# Patient Record
Sex: Female | Born: 1995 | Race: Black or African American | Hispanic: No | Marital: Single | Smoking: Never smoker
Health system: Southern US, Community
[De-identification: ages and names within clinical notes are randomized; demographics above are authoritative.]

## PROBLEM LIST (undated history)

## (undated) DIAGNOSIS — D219 Benign neoplasm of connective and other soft tissue, unspecified: Secondary | ICD-10-CM

## (undated) DIAGNOSIS — E162 Hypoglycemia, unspecified: Secondary | ICD-10-CM

## (undated) DIAGNOSIS — L309 Dermatitis, unspecified: Secondary | ICD-10-CM

## (undated) HISTORY — DX: Hypoglycemia, unspecified: E16.2

## (undated) HISTORY — DX: Dermatitis, unspecified: L30.9

## (undated) HISTORY — PX: OTHER SURGICAL HISTORY: SHX169

## (undated) HISTORY — PX: ROOT CANAL: SHX2363

---
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
## 2015-01-04 ENCOUNTER — Ambulatory Visit: Payer: Medicaid Other

## 2015-01-07 ENCOUNTER — Encounter: Payer: Self-pay | Admitting: *Deleted

## 2015-01-07 ENCOUNTER — Ambulatory Visit: Payer: Medicaid Other

## 2015-01-07 ENCOUNTER — Ambulatory Visit (INDEPENDENT_AMBULATORY_CARE_PROVIDER_SITE_OTHER): Payer: Medicaid Other | Admitting: *Deleted

## 2015-01-07 DIAGNOSIS — Z3042 Encounter for surveillance of injectable contraceptive: Secondary | ICD-10-CM | POA: Diagnosis not present

## 2015-01-07 DIAGNOSIS — Z3202 Encounter for pregnancy test, result negative: Secondary | ICD-10-CM

## 2015-01-07 LAB — POCT URINE PREGNANCY: Preg Test, Ur: NEGATIVE

## 2015-01-07 MED ORDER — MEDROXYPROGESTERONE ACETATE 150 MG/ML IM SUSP
150.0000 mg | Freq: Once | INTRAMUSCULAR | Status: AC
  Administered 2015-01-07: 150 mg via INTRAMUSCULAR

## 2015-01-07 NOTE — Progress Notes (Signed)
Pt here for Depo. Reports no problems at this time. Return in 12 weeks for next shot. [name] 

## 2015-03-07 ENCOUNTER — Encounter: Payer: Self-pay | Admitting: Orthopedic Surgery

## 2015-03-07 ENCOUNTER — Ambulatory Visit (INDEPENDENT_AMBULATORY_CARE_PROVIDER_SITE_OTHER): Payer: Medicaid Other

## 2015-03-07 ENCOUNTER — Ambulatory Visit (INDEPENDENT_AMBULATORY_CARE_PROVIDER_SITE_OTHER): Payer: Medicaid Other | Admitting: Orthopedic Surgery

## 2015-03-07 VITALS — BP 108/70 | Ht 63.0 in | Wt 110.0 lb

## 2015-03-07 DIAGNOSIS — M222X2 Patellofemoral disorders, left knee: Secondary | ICD-10-CM

## 2015-03-07 DIAGNOSIS — M25562 Pain in left knee: Secondary | ICD-10-CM | POA: Diagnosis not present

## 2015-03-07 MED ORDER — DICLOFENAC SODIUM 75 MG PO TBEC: 75.0000 mg | DELAYED_RELEASE_TABLET | Freq: Two times a day (BID) | ORAL | Status: DC

## 2015-03-07 NOTE — Progress Notes (Signed)
Patient ID: Heidi Foley, female   DOB: 09/15/1995, 19 y.o.   MRN: [id number]  Chief Complaint  Patient presents with  . Knee Pain    left knee pain, referred by B Mann    HPI Heidi Foley is a 19 y.o. female.   The patient presents for evaluation of ongoing left knee pain for 1 year. She denies any trauma. 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 She complains of pain locking stiffness giving way anterior aspect left knee associated with squatting kneeling bending climbing and sitting prolonged periods of time    She was treated with naproxen but she only took in a week because it wasn't working she also took some ibuprofen prior to  Review of systems constipation seasonal food allergies rashes vision problems  Review of Systems Review of Systems  Past Medical History  Diagnosis Date  . Fibroids   . Asthma     Past Surgical History  Procedure Laterality Date  . Nailbed surgery      nailbed reconstruction left ring finger  . Root canal      Family History  Problem Relation Age of Onset  . Hypertension Mother   . Heart disease Other   . Cancer Other   . Diabetes Other   . Stroke Other     Social History Social History  Substance Use Topics  . Smoking status: Never Smoker   . Smokeless tobacco: Never Used  . Alcohol Use: No    Allergies  Allergen Reactions  . Bee Venom Anaphylaxis  . Citrus Swelling    Current Outpatient Prescriptions  Medication Sig Dispense Refill  . medroxyPROGESTERone (DEPO-PROVERA) 150 MG/ML injection INJECT INTRAMUSCULARLY IN THE OFFICE EVERY 3 MONTHS 1 mL 3  . diclofenac (VOLTAREN) 75 MG EC tablet Take 1 tablet (75 mg total) by mouth 2 (two) times daily with a meal. 60 tablet 2   No current facility-administered medications for this visit.       Physical Exam Physical Exam Blood pressure 108/70, height 5\' 3"  (1.6 m), weight 110 lb (49.896 kg).  Her appearance is normal for height and weight appropriate. She is oriented 3  mood and affect are normal. She has normal gait pattern and stance normal except for hyperextension of both knees.  Right knee exam was normal. Patellofemoral joint was nontender. Apprehension was negative. Ligaments were stable hip range of motion was normal muscle tone and strength are normal skin intact sensation normal pulses good. Flexible pes planus  Left knee very tender patellofemoral joint medial and lateral facet no apprehension for range of motion. Strength normal stability tests normal neurovascular exam intact skin normal    Data Reviewed  I ordered and reviewed an x-ray which was normal  Assessment     patellofemoral pain syndrome     Plan     physical therapy 6 weeks Meds ordered this encounter  Medications  . diclofenac (VOLTAREN) 75 MG EC tablet    Sig: Take 1 tablet (75 mg total) by mouth 2 (two) times daily with a meal.    Dispense:  60 tablet    Refill:  2     Recheck 2 months       Arther Abbott 03/07/2015, 11:44 AM

## 2015-03-07 NOTE — Patient Instructions (Signed)
Call aph therapy dept to schedule therapy visits  Orthotics  New med, diclofenac

## 2015-03-15 ENCOUNTER — Ambulatory Visit (HOSPITAL_COMMUNITY): Payer: Medicaid Other | Attending: Orthopedic Surgery | Admitting: Physical Therapy

## 2015-03-15 DIAGNOSIS — R29898 Other symptoms and signs involving the musculoskeletal system: Secondary | ICD-10-CM

## 2015-03-15 DIAGNOSIS — M25562 Pain in left knee: Secondary | ICD-10-CM | POA: Diagnosis present

## 2015-03-15 DIAGNOSIS — R262 Difficulty in walking, not elsewhere classified: Secondary | ICD-10-CM | POA: Diagnosis present

## 2015-03-15 NOTE — Therapy (Signed)
Heidi Foley, [place], [address] Phone: [phone]   Fax:  [phone]  Physical Therapy Evaluation  Patient Details  Name: Heidi Foley MRN: [id number] Date of Birth: [birth year] Referring Provider:  Carole Civil, MD  Encounter Date: 03/15/2015      PT End of Session - 03/15/15 1611    Visit Number 1   Number of Visits 18   Date for PT Re-Evaluation 04/14/15   Authorization Type medicaid   PT Start Time 1540   PT Stop Time 1610   PT Time Calculation (min) 30 min   Activity Tolerance Patient tolerated treatment well   Behavior During Therapy [facility] for tasks assessed/performed      Past Medical History  Diagnosis Date  . Fibroids   . Asthma     Past Surgical History  Procedure Laterality Date  . Nailbed surgery      nailbed reconstruction left ring finger  . Root canal      There were no vitals filed for this visit.  Visit Diagnosis:  Knee pain, acute, left  Left leg weakness  Difficulty walking      Subjective Assessment - 03/15/15 1540    Subjective Heidi Foley states that she has been dancing for several years and has had progressive knee pain for about the past year.  She has been referreed to PT to attempt to decrease her pain. She states that if she bends her knee too long or is walking too long she will have increased pain.  She states that she was at work when her knee almost gave out.     How long can you sit comfortably? 30 minutes    How long can you stand comfortably? ten minutes    How long can you walk comfortably? 5-10 minutes.    Patient Stated Goals less pain    Currently in Pain? No/denies  highest is a 8/10    Pain Location Knee   Pain Orientation Left;Anterior;Medial   Pain Descriptors / Indicators Burning;Sharp   Pain Type Chronic pain  progressinve             [facility] PT Assessment - 03/15/15 0001    Assessment   Medical Diagnosis Lt anterior knee pain   Onset Date/Surgical Date 03/14/14   Next MD Visit October    Prior Therapy none   Precautions   Precautions None   Restrictions   Weight Bearing Restrictions No   Balance Screen   Has the patient fallen in the past 6 months No   Has the patient had a decrease in activity level because of a fear of falling?  No   Is the patient reluctant to leave their home because of a fear of falling?  No   Prior Function   Level of Independence Independent   Vocation Part time employment;Student   Vocation Requirements standing   Cognition   Overall Cognitive Status Within Functional Limits for tasks assessed   ROM / Strength   AROM / PROM / Strength AROM;Strength   AROM   AROM Assessment Site Knee   Strength   Strength Assessment Site Hip;Knee;Ankle   Right/Left Hip Left   Left Hip Flexion 3/5   Left Hip ABduction 4/5   Right/Left Knee Left   Left Knee Flexion 3+/5   Left Knee Extension 3+/5   Right/Left Ankle Left   Left Ankle Dorsiflexion 4+/5   Flexibility   Soft Tissue Assessment /Muscle Length yes  Hamstrings Rt 155;Lt 140   Quadriceps Heel to buttock B              PT Education - 03/15/15 1610    Education provided Yes   Education Details Strengthening for hip and knee mm   Person(s) Educated Patient   Methods Explanation;Verbal cues;Handout   Comprehension Verbalized understanding;Returned demonstration          PT Short Term Goals - 03/15/15 1618    PT SHORT TERM GOAL #1   Title I HEP   Time 1   Period Days   PT SHORT TERM GOAL #2   Title Pt to state her pain goes no higher than a 4/10 with normal activity    Time 4   Period Weeks   PT SHORT TERM GOAL #3   Title Pt mm strength to have increased 1/2 grade to allow pt to walk for 20 minutes without pain   Time 4   Period Weeks   PT SHORT TERM GOAL #4   Title Pt to be able to sit for an hour to concenrate on lectures without increased knee paiin   Time 4   Period Weeks           PT Long Term Goals  - 03/15/15 1621    PT LONG TERM GOAL #1   Title I in advance HEP   Time 7   Period Weeks   PT LONG TERM GOAL #2   Title Pt pain to be no greater than a 2/10 with activity   Time 7   Period Weeks   PT LONG TERM GOAL #3   Title Pt strength to by 5/5 to allow the above to occur   Time 7   Period Weeks   PT LONG TERM GOAL #4   Title Hamstring length to be to 155 to decrease stress on knee joint    Time 7   Period Weeks   PT LONG TERM GOAL #5   Title Pt to be able to complete workday without increased pain   Time 7   Period Weeks               Plan - 03/15/15 1612    Clinical Impression Statement Heidi Foley is an 19 yo female who has been having progressive Lt knee pain for the past year.  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  Examination demonstrates decreased hamstring length and decreased hip and knee strength of her Lt LE resulting in increased pain.  Heidi Foley will benefit from skilled PT to address the above issues and improve her quality of life.l    Pt will benefit from skilled therapeutic intervention in order to improve on the following deficits Decreased activity tolerance;Pain;Difficulty walking;Decreased strength   Rehab Potential Good   PT Frequency 3x / week   PT Duration 6 weeks   PT Treatment/Interventions ADLs/Self Care Home Management;Stair training;Therapeutic activities;Therapeutic exercise;Balance training;Neuromuscular re-education;Patient/family education   PT Next Visit Plan Pt to begin rockerboard; Terminal extension with LE ER in both standing and supine; SLS progressing to vector stances, functional squats progressing to Lt squat only, heel raises , and lunging    PT Home Exercise Plan given   Consulted and Agree with Plan of Care Patient         Problem List There are no active problems to display for this patient.  Heidi Foley, PT  CLT [phone] 03/15/2015, 4:24 PM  [place] [address] [place], [place], [address] Phone: [phone]   Fax:  [phone]

## 2015-03-15 NOTE — Patient Instructions (Addendum)
Knee Extension (Sitting)   Place __3__ pound weight on left ankle and straighten knee fully, lower slowly. Repeat ___10-15_ times per set. Do ___1_ sets per session. Do ___2_ sessions per day.  http://orth.exer.us/732   Copyright  VHI. All rights reserved.  Straight Leg Raise: With External Leg Rotation   Lie on back with right leg straight, opposite leg bent. Rotate straight leg out and lift _20___ inches. Repeat __10-115__ times per set. Do __1__ sets per session. Do _2___ sessions per day.  http://orth.exer.us/728   Copyright  VHI. All rights reserved.  Strengthening: Quadriceps Set   Tighten muscles on top of thighs by pushing knees down into surface. Hold _3___ seconds. Repeat _10___ times per set. Do ___1_ sets per session. Do __2__ sessions per day.  http://orth.exer.us/602   Copyright  VHI. All rights reserved.  Strengthening: Hip Abduction (Side-Lying)  With 3# weight  Tighten muscles on front of left thigh, then lift leg _20___ inches from surface, keeping knee locked.  Repeat ___10_ times per set. Do _1___ sets per session. Do _2___ sessions per day.  http://orth.exer.us/622   Copyright  VHI. All rights reserved.  Strengthening: Hip Extension (Prone)  With 3# weight on your foot  Tighten muscles on front of left thigh, then lift leg __2__ inches from surface, keeping knee locked. Repeat _10-15___ times per set. Do __1__ sets per session. Do _2___ sessions per day.  http://orth.exer.us/620   Copyright  VHI. All rights reserved.  Self-Mobilization: Knee Flexion (Prone)  With a 3# weight on your ankle Bring left heel toward buttocks as close as possible. Hold ___2_ seconds. Relax. Repeat __10__ times per set. Do _1___ sets per session. Do __2__ sessions per day.  http://orth.exer.us/596   Copyright  VHI. All rights reserved.  Stretching: Hamstring (Supine)   Supporting right thigh behind knee, slowly straighten knee until stretch is felt in back  of thigh. Hold __30__ seconds. Repeat ___3_ times per set. Do _1___ sets per session. Do ___2_ sessions per day.  http://orth.exer.us/656   Copyright  VHI. All rights reserved.  Heel Raise: Bilateral (Standing)   Rise on balls of feet. Repeat __10__ times per set. Do _1___ sets per session. Do __2__ sessions per day.  http://orth.exer.us/38   Copyright  VHI. All rights reserved.  Functional Quadriceps: Chair Squat   Keeping feet flat on floor, shoulder width apart, squat as low as is comfortable. Use support as necessary. Repeat _10___ times per set. Do __1__ sets per session. Do _2___ sessions per day.  http://orth.exer.us/736   Copyright  VHI. All rights reserved.

## 2015-03-29 ENCOUNTER — Ambulatory Visit (HOSPITAL_COMMUNITY): Payer: Medicaid Other | Attending: Orthopedic Surgery

## 2015-03-29 ENCOUNTER — Encounter (HOSPITAL_COMMUNITY): Payer: Self-pay

## 2015-03-29 DIAGNOSIS — R262 Difficulty in walking, not elsewhere classified: Secondary | ICD-10-CM | POA: Diagnosis present

## 2015-03-29 DIAGNOSIS — R29898 Other symptoms and signs involving the musculoskeletal system: Secondary | ICD-10-CM | POA: Insufficient documentation

## 2015-03-29 DIAGNOSIS — M25562 Pain in left knee: Secondary | ICD-10-CM

## 2015-03-29 NOTE — Therapy (Addendum)
[place], [place], [address] Phone: [phone]   Fax:  [phone]  Physical Therapy Treatment  Patient Details  Name: KIMYETTA FLOTT MRN: [id number] Date of Birth: 1996-01-29 Referring Provider:  Carole Civil, MD  Encounter Date: 03/29/2015      PT End of Session - 03/29/15 1638    Visit Number 2   Number of Visits 18   Date for PT Re-Evaluation 04/14/15   Authorization Type medicaid   Authorization - Visit Number 2   PT Start Time 2023   PT Stop Time 3435   PT Time Calculation (min) 44 min   Activity Tolerance Patient tolerated treatment well;No increased pain   Behavior During Therapy [facility] for tasks assessed/performed      Past Medical History  Diagnosis Date  . Fibroids   . Asthma     Past Surgical History  Procedure Laterality Date  . Nailbed surgery      nailbed reconstruction left ring finger  . Root canal      There were no vitals filed for this visit.  Visit Diagnosis:  Knee pain, acute, left  Left leg weakness  Difficulty walking      Subjective Assessment - 03/29/15 1631    Subjective Knee pain remains about the same, but pt now reports new calf pain which she also relates to current issue. Pt is unclear about HEP, but is able to demonstrate some of it when cued.                          [place] Adult PT Treatment/Exercise - 03/29/15 0001    Lumbar Exercises: Prone   Other Prone Lumbar Exercises Plank on knees, 10x3s  cues given for neutral pelvis, avoiding lordosis   Knee/Hip Exercises: Stretches   Gastroc Stretch Both;3 reps;30 seconds  heels hanging off 8" step   Knee/Hip Exercises: Standing   Functional Squat 2 sets;Limitations  2x12, using 14" box for butt taps.    Knee/Hip Exercises: Supine   Bridges with Clamshell 2 sets;10 reps;Strengthening;Both  GreenTB + Transverse Abd cues   Other Supine Knee/Hip Exercises Clam Shells  2x10 Bilat GreenTB     Knee/Hip Exercises: Sidelying   Hip ABduction 2 sets;15 reps;Both  heavy cues given for joint alignment.    Manual Therapy   Myofascial Release 5 minutes  medial L soleus release                  PT Short Term Goals - 03/15/15 1618    PT SHORT TERM GOAL #1   Title I HEP   Time 1   Period Days   PT SHORT TERM GOAL #2   Title Pt to state her pain goes no higher than a 4/10 with normal activity    Time 4   Period Weeks   PT SHORT TERM GOAL #3   Title Pt mm strength to have increased 1/2 grade to allow pt to walk for 20 minutes without pain   Time 4   Period Weeks   PT SHORT TERM GOAL #4   Title Pt to be able to sit for an hour to concenrate on lectures without increased knee paiin   Time 4   Period Weeks           PT Long Term Goals - 03/15/15 1621    PT LONG TERM GOAL #1   Title I in advance HEP  Time 7   Period Weeks   PT LONG TERM GOAL #2   Title Pt pain to be no greater than a 2/10 with activity   Time 7   Period Weeks   PT LONG TERM GOAL #3   Title Pt strength to by 5/5 to allow the above to occur   Time 7   Period Weeks   PT LONG TERM GOAL #4   Title Hamstring length to be to 155 to decrease stress on knee joint    Time 7   Period Weeks   PT LONG TERM GOAL #5   Title Pt to be able to complete workday without increased pain   Time 7   Period Weeks               Plan - 03/29/15 1633    Clinical Impression Statement Pt is making progress, responding well to postural education and performance of thereapeutic exercises with appropriate posture adn mototr control. Pt contniues to demonstrate the greatest limtations in core strength, and the ability to maintain neutral posture with daily acitvities and mobility. Pt will continue to benefit from core strengthening, postural education, and  pain management techniques in the LLE.    Pt will benefit from skilled therapeutic intervention in order to improve on the following deficits Decreased  activity tolerance;Pain;Difficulty walking;Decreased strength   Rehab Potential Good   PT Frequency 3x / week   PT Duration 6 weeks   PT Treatment/Interventions ADLs/Self Care Home Management;Stair training;Therapeutic activities;Therapeutic exercise;Balance training;Neuromuscular re-education;Patient/family education   PT Next Visit Plan Continue to progress pt core strength, finding neutral pelvic position with all activity, avoiding excessing lumbar lordosis, and avoiding genu recurevatum bilat.    PT Home Exercise Plan updated and reviewed; patient asked to modify calf raises to 4x10 to decrease soleus aggravation, and added calf stretching. Pt was quite TFL domiant with sidelying abduction and educated on maintaining a neutral pelvic posture with this activity.    Consulted and Agree with Plan of Care Patient        Problem List There are no active problems to display for this patient.   Janeliz Prestwood C 03/29/2015, 4:43 PM  4:43 PM  Etta Grandchild, PT, DPT [place] License # [id number]        [facility] [address] [place], [place], [address] Phone: [phone]   Fax:  [phone]  PHYSICAL THERAPY DISCHARGE SUMMARY  Visits from Start of Care: 2  Current functional level related to goals / functional outcomes: No change   Remaining deficits: same   Education / Equipment: HE{  Plan: Patient agrees to discharge.  Patient goals were not met. Patient is being discharged due to the patient's request.  ?????       PT is in college and school schedule does not allow her to come to appointments.  Rayetta Humphrey, [place] CLT [phone]

## 2015-04-02 ENCOUNTER — Ambulatory Visit: Payer: Medicaid Other

## 2015-04-02 ENCOUNTER — Encounter: Payer: Self-pay | Admitting: Obstetrics & Gynecology

## 2015-04-03 ENCOUNTER — Telehealth (HOSPITAL_COMMUNITY): Payer: Self-pay | Admitting: Physical Therapy

## 2015-04-03 ENCOUNTER — Ambulatory Visit (HOSPITAL_COMMUNITY): Payer: Medicaid Other | Admitting: Physical Therapy

## 2015-04-04 NOTE — Telephone Encounter (Signed)
Pt called re: missed appointments.  Pt mother stated pt was at school.  Left message to please have pt call the clinic.  Rayetta Humphrey, [place] CLT [phone]

## 2015-04-05 ENCOUNTER — Encounter: Payer: Self-pay | Admitting: *Deleted

## 2015-04-05 ENCOUNTER — Telehealth (HOSPITAL_COMMUNITY): Payer: Self-pay

## 2015-04-05 ENCOUNTER — Ambulatory Visit (HOSPITAL_COMMUNITY): Payer: Medicaid Other

## 2015-04-05 ENCOUNTER — Ambulatory Visit (INDEPENDENT_AMBULATORY_CARE_PROVIDER_SITE_OTHER): Payer: Medicaid Other | Admitting: *Deleted

## 2015-04-05 DIAGNOSIS — Z3042 Encounter for surveillance of injectable contraceptive: Secondary | ICD-10-CM

## 2015-04-05 DIAGNOSIS — Z3202 Encounter for pregnancy test, result negative: Secondary | ICD-10-CM

## 2015-04-05 LAB — POCT URINE PREGNANCY: Preg Test, Ur: NEGATIVE

## 2015-04-05 MED ORDER — MEDROXYPROGESTERONE ACETATE 150 MG/ML IM SUSP
150.0000 mg | Freq: Once | INTRAMUSCULAR | Status: AC
  Administered 2015-04-05: 150 mg via INTRAMUSCULAR

## 2015-04-05 NOTE — Progress Notes (Signed)
Pt here for Depo. Reports no problems at this time. Return in 12 weeks for next shot. [name] 

## 2015-04-05 NOTE — Telephone Encounter (Signed)
No show for apt today, called and left message indicating missed apt., next apt date and time, informed policy for 3 no shows, contact information given.  [address], [place]; [facility] [phone]

## 2015-04-08 ENCOUNTER — Ambulatory Visit (HOSPITAL_COMMUNITY): Payer: Medicaid Other

## 2015-04-10 ENCOUNTER — Encounter (HOSPITAL_COMMUNITY): Payer: Medicaid Other | Admitting: Physical Therapy

## 2015-04-12 ENCOUNTER — Encounter (HOSPITAL_COMMUNITY): Payer: Medicaid Other | Admitting: Physical Therapy

## 2015-04-15 ENCOUNTER — Encounter (HOSPITAL_COMMUNITY): Payer: Medicaid Other | Admitting: Physical Therapy

## 2015-04-17 ENCOUNTER — Encounter (HOSPITAL_COMMUNITY): Payer: Medicaid Other | Admitting: Physical Therapy

## 2015-04-19 ENCOUNTER — Encounter (HOSPITAL_COMMUNITY): Payer: Medicaid Other | Admitting: Physical Therapy

## 2015-04-22 ENCOUNTER — Encounter (HOSPITAL_COMMUNITY): Payer: Medicaid Other | Admitting: Physical Therapy

## 2015-04-24 ENCOUNTER — Encounter (HOSPITAL_COMMUNITY): Payer: Medicaid Other

## 2015-04-26 ENCOUNTER — Encounter (HOSPITAL_COMMUNITY): Payer: Medicaid Other | Admitting: Physical Therapy

## 2015-04-29 ENCOUNTER — Encounter (HOSPITAL_COMMUNITY): Payer: Medicaid Other | Admitting: Physical Therapy

## 2015-05-01 ENCOUNTER — Encounter (HOSPITAL_COMMUNITY): Payer: Medicaid Other | Admitting: Physical Therapy

## 2015-05-03 ENCOUNTER — Encounter (HOSPITAL_COMMUNITY): Payer: Medicaid Other

## 2015-05-06 ENCOUNTER — Encounter (HOSPITAL_COMMUNITY): Payer: Medicaid Other | Admitting: Physical Therapy

## 2015-05-08 ENCOUNTER — Encounter (HOSPITAL_COMMUNITY): Payer: Medicaid Other | Admitting: Physical Therapy

## 2015-05-10 ENCOUNTER — Encounter (HOSPITAL_COMMUNITY): Payer: Medicaid Other

## 2015-05-13 ENCOUNTER — Encounter: Payer: Self-pay | Admitting: Orthopedic Surgery

## 2015-05-13 ENCOUNTER — Ambulatory Visit: Payer: Medicaid Other | Admitting: Orthopedic Surgery

## 2015-05-31 ENCOUNTER — Other Ambulatory Visit (HOSPITAL_COMMUNITY): Payer: Self-pay | Admitting: Family Medicine

## 2015-05-31 ENCOUNTER — Ambulatory Visit (HOSPITAL_COMMUNITY)
Admission: RE | Admit: 2015-05-31 | Discharge: 2015-05-31 | Disposition: A | Discharge status: Home / Self Care | Payer: Medicaid Other | Source: Ambulatory Visit | Attending: Family Medicine | Admitting: Family Medicine

## 2015-05-31 DIAGNOSIS — R519 Headache, unspecified: Secondary | ICD-10-CM

## 2015-05-31 DIAGNOSIS — R51 Headache: Principal | ICD-10-CM

## 2015-06-28 ENCOUNTER — Ambulatory Visit: Payer: Medicaid Other

## 2015-07-01 ENCOUNTER — Ambulatory Visit (INDEPENDENT_AMBULATORY_CARE_PROVIDER_SITE_OTHER): Payer: Medicaid Other | Admitting: *Deleted

## 2015-07-01 ENCOUNTER — Encounter: Payer: Self-pay | Admitting: *Deleted

## 2015-07-01 DIAGNOSIS — Z3202 Encounter for pregnancy test, result negative: Secondary | ICD-10-CM

## 2015-07-01 DIAGNOSIS — Z3042 Encounter for surveillance of injectable contraceptive: Secondary | ICD-10-CM

## 2015-07-01 LAB — POCT URINE PREGNANCY: Preg Test, Ur: NEGATIVE

## 2015-07-01 MED ORDER — MEDROXYPROGESTERONE ACETATE 150 MG/ML IM SUSP
150.0000 mg | Freq: Once | INTRAMUSCULAR | Status: AC
  Administered 2015-07-01: 150 mg via INTRAMUSCULAR

## 2015-07-01 NOTE — Progress Notes (Signed)
Pt here for Depo. Reports no problems at this time. Return in 12 weeks for next shot. [name] 

## 2015-09-16 ENCOUNTER — Ambulatory Visit (INDEPENDENT_AMBULATORY_CARE_PROVIDER_SITE_OTHER): Payer: BC Managed Care – PPO

## 2015-09-16 ENCOUNTER — Ambulatory Visit (INDEPENDENT_AMBULATORY_CARE_PROVIDER_SITE_OTHER): Payer: BC Managed Care – PPO | Admitting: Orthopedic Surgery

## 2015-09-16 ENCOUNTER — Encounter: Payer: Self-pay | Admitting: Orthopedic Surgery

## 2015-09-16 VITALS — BP 104/66 | Ht 63.0 in | Wt 115.0 lb

## 2015-09-16 DIAGNOSIS — M5410 Radiculopathy, site unspecified: Secondary | ICD-10-CM

## 2015-09-16 MED ORDER — METHOCARBAMOL 500 MG PO TABS: 500.0000 mg | ORAL_TABLET | Freq: Every day | ORAL | Status: DC

## 2015-09-16 MED ORDER — IBUPROFEN 800 MG PO TABS: 800.0000 mg | ORAL_TABLET | Freq: Three times a day (TID) | ORAL | Status: DC

## 2015-09-16 MED ORDER — PREDNISONE 5 MG (48) PO TBPK: ORAL_TABLET | ORAL | Status: DC

## 2015-09-16 NOTE — Patient Instructions (Addendum)
  Recommend 5 mg dose pack x 12 days  Robaxin 500 mg daily at bedtime Ibuprofen 800 3 times a day 4 weeks f/u   Radicular Pain Radicular pain in either the arm or leg is usually from a bulging or herniated disk in the spine. A piece of the herniated disk may press against the nerves as the nerves exit the spine. This causes pain which is felt at the tips of the nerves down the arm or leg. Other causes of radicular pain may include:  Fractures.  Heart disease.  Cancer.  An abnormal and usually degenerative state of the nervous system or nerves (neuropathy). Diagnosis may require CT or MRI scanning to determine the primary cause.  Nerves that start at the neck (nerve roots) may cause radicular pain in the outer shoulder and arm. It can spread down to the thumb and fingers. The symptoms vary depending on which nerve root has been affected. In most cases radicular pain improves with conservative treatment. Neck problems may require physical therapy, a neck collar, or cervical traction. Treatment may take many weeks, and surgery may be considered if the symptoms do not improve.  Conservative treatment is also recommended for sciatica. Sciatica causes pain to radiate from the lower back or buttock area down the leg into the foot. Often there is a history of back problems. Most patients with sciatica are better after 2 to 4 weeks of rest and other supportive care. Short term bed rest can reduce the disk pressure considerably. Sitting, however, is not a good position since this increases the pressure on the disk. You should avoid bending, lifting, and all other activities which make the problem worse. Traction can be used in severe cases. Surgery is usually reserved for patients who do not improve within the first months of treatment. Only take over-the-counter or prescription medicines for pain, discomfort, or fever as directed by your caregiver. Narcotics and muscle relaxants may help by relieving more  severe pain and spasm and by providing mild sedation. Cold or massage can give significant relief. Spinal manipulation is not recommended. It can increase the degree of disc protrusion. Epidural steroid injections are often effective treatment for radicular pain. These injections deliver medicine to the spinal nerve in the space between the protective covering of the spinal cord and back bones (vertebrae). Your caregiver can give you more information about steroid injections. These injections are most effective when given within two weeks of the onset of pain.  You should see your caregiver for follow up care as recommended. A program for neck and back injury rehabilitation with stretching and strengthening exercises is an important part of management.  SEEK IMMEDIATE MEDICAL CARE IF:  You develop increased pain, weakness, or numbness in your arm or leg.  You develop difficulty with bladder or bowel control.  You develop abdominal pain.   This information is not intended to replace advice given to you by your health care provider. Make sure you discuss any questions you have with your health care provider.   Document Released: 08/20/2004 Document Revised: 08/03/2014 Document Reviewed: 02/06/2015 Elsevier Interactive Patient Education [employer].

## 2015-09-16 NOTE — Progress Notes (Signed)
Patient ID: Heidi Foley, female   DOB: [birth year], 20 y.o.   MRN: [id number]  Chief Complaint  Patient presents with  . Follow-up    RE EVAL LEFT KNEE PAIN AND WEAKNESS    HPI Heidi Foley is a 20 y.o. female.  Comes back in after treatment for anterior knee pain complaining of left knee pain and weakness. Although she denies back pain she says her left hip does hurt and places her hand on her lower back. She has radicular pain numbness and tingling in the left leg and giving way symptoms and this is been present for several weeks now. She was advised by primary care to stop dancing  The pain in her knee is referred from the back.  No fever or chills no weight loss. The numbness tingling as noted. Some intermittent gait abnormalities. No tremor no seizure no anxiety or depression. Her bowel and bladder function remains intact  Review of Systems Review of Systems history of present illness    Past Medical History  Diagnosis Date  . Fibroids   . Asthma     Past Surgical History  Procedure Laterality Date  . Nailbed surgery      nailbed reconstruction left ring finger  . Root canal      Family History  Problem Relation Age of Onset  . Hypertension Mother   . Heart disease Other   . Cancer Other   . Diabetes Other   . Stroke Other     Social History Social History  Substance Use Topics  . Smoking status: Never Smoker   . Smokeless tobacco: Never Used  . Alcohol Use: No    Allergies  Allergen Reactions  . Bee Venom Anaphylaxis  . Citrus Swelling    Current Outpatient Prescriptions  Medication Sig Dispense Refill  . medroxyPROGESTERone (DEPO-PROVERA) 150 MG/ML injection INJECT INTRAMUSCULARLY IN THE OFFICE EVERY 3 MONTHS 1 mL 3   No current facility-administered medications for this visit.       Physical Exam Physical Exam Blood pressure 104/66, height 5\' 3"  (1.6 m), weight 115 lb (52.164 kg). Appearance, there are no abnormalities in terms of  appearance the patient was well-developed and well-nourished. The grooming and hygiene were normal.  Mental status orientation, there was normal alertness and orientation Mood pleasant Ambulatory status normal with no assistive devices  Examination of the lumbar spine reveal tenderness at the L4-5 disc space and also on the  right gluteal area  and lower back.  Decreased spinal flexion extension and rotation were noted.  The lower extremities exhibited normal dorsiflexion plantar flexion extension flexion of the knee as well as hip flexion. No atrophy was noted. The skin overlying the lumbar thoracic and cervical regions was warm dry and intact without laceration. The lower extremities also had normal skin.  Neurologic examination  Reflexes were 2+ and equal  Sensation wasabnormal with decreased sensation in L4 and L5 sensory dermatomes on the left compared to the right   Babinski's tests were down going  Straight leg raise testing wasabnormal with positive findings on the left   The vascular examination revealed normal dorsalis pedis pulses in both feet and both feet were warm with good capillary refill   Data Reviewed Have ordered a plain film of the lumbar spine and the x-ray was taken in the office and I read it as follows  Mild scoliosis Assessment  Sciatic nerve pain  Plan  Recommend 5 mg dose pack x 12 days  Robaxin  500 mg daily at bedtime Ibuprofen 803 times a day 4 weeks f/u

## 2015-10-14 ENCOUNTER — Ambulatory Visit (INDEPENDENT_AMBULATORY_CARE_PROVIDER_SITE_OTHER): Payer: BC Managed Care – PPO | Admitting: Orthopedic Surgery

## 2015-10-14 ENCOUNTER — Encounter: Payer: Self-pay | Admitting: Orthopedic Surgery

## 2015-10-14 VITALS — Ht 63.0 in | Wt 115.0 lb

## 2015-10-14 DIAGNOSIS — M5442 Lumbago with sciatica, left side: Secondary | ICD-10-CM | POA: Diagnosis not present

## 2015-10-14 DIAGNOSIS — M5116 Intervertebral disc disorders with radiculopathy, lumbar region: Secondary | ICD-10-CM | POA: Diagnosis not present

## 2015-10-14 MED ORDER — GABAPENTIN 100 MG PO CAPS: 100.0000 mg | ORAL_CAPSULE | Freq: Three times a day (TID) | ORAL | Status: DC

## 2015-10-14 MED ORDER — NAPROXEN 500 MG PO TABS: 500.0000 mg | ORAL_TABLET | Freq: Two times a day (BID) | ORAL | Status: DC

## 2015-10-14 NOTE — Patient Instructions (Signed)
TWO MEDICATIONS SENT TO PHARMACY  WE WILL SCHEDULE MRI FOR YOU AND CALL YOU WITH APPT

## 2015-10-14 NOTE — Progress Notes (Signed)
Chief Complaint  Patient presents with  . Follow-up    FOLLOW UP BACK    Follow-up visit 20 year old female left leg pain and weakness. I treated her with a Dosepak Robaxin and ibuprofen for 4 weeks her pain is worse  At this point it is prudent to get an MRI to evaluate disc for possible compression Review of Systems  Constitutional: Negative for fever, chills, weight loss and malaise/fatigue.  Musculoskeletal: Positive for back pain. Negative for falls.  Neurological: Positive for tingling and sensory change.    MRI will be ordered  New medications  Meds ordered this encounter  Medications  . gabapentin (NEURONTIN) 100 MG capsule    Sig: Take 1 capsule (100 mg total) by mouth 3 (three) times daily.    Dispense:  30 capsule    Refill:  0  . naproxen (NAPROSYN) 500 MG tablet    Sig: Take 1 tablet (500 mg total) by mouth 2 (two) times daily with a meal.    Dispense:  60 tablet    Refill:  0

## 2015-10-20 ENCOUNTER — Other Ambulatory Visit: Payer: Self-pay | Admitting: Adult Health

## 2015-10-21 ENCOUNTER — Ambulatory Visit (INDEPENDENT_AMBULATORY_CARE_PROVIDER_SITE_OTHER): Payer: BC Managed Care – PPO | Admitting: *Deleted

## 2015-10-21 ENCOUNTER — Other Ambulatory Visit: Payer: Self-pay | Admitting: *Deleted

## 2015-10-21 ENCOUNTER — Encounter: Payer: Self-pay | Admitting: *Deleted

## 2015-10-21 DIAGNOSIS — Z3042 Encounter for surveillance of injectable contraceptive: Secondary | ICD-10-CM | POA: Diagnosis not present

## 2015-10-21 LAB — POCT URINE PREGNANCY: Preg Test, Ur: NEGATIVE

## 2015-10-21 MED ORDER — MEDROXYPROGESTERONE ACETATE 150 MG/ML IM SUSP: INTRAMUSCULAR | Status: DC

## 2015-10-21 MED ORDER — MEDROXYPROGESTERONE ACETATE 150 MG/ML IM SUSP
150.0000 mg | Freq: Once | INTRAMUSCULAR | Status: AC
  Administered 2015-10-21: 150 mg via INTRAMUSCULAR

## 2015-10-21 NOTE — Progress Notes (Signed)
Patient ID: Heidi Foley, female   DOB: 04-03-1996, 20 y.o.   MRN: [id number] Depo Provera 150 mg IM left deltoid given with no complications, negative pregnancy test. Pt late for depo injection x 2 weeks, discussed with Dr. Elonda Husky. Pt states last sex months ago, ok to give. Pt to return in 12 weeks for next injection.

## 2015-10-24 ENCOUNTER — Ambulatory Visit (HOSPITAL_COMMUNITY)
Admission: RE | Admit: 2015-10-24 | Discharge: 2015-10-24 | Disposition: A | Discharge status: Home / Self Care | Payer: BC Managed Care – PPO | Source: Ambulatory Visit | Attending: Orthopedic Surgery | Admitting: Orthopedic Surgery

## 2015-10-24 DIAGNOSIS — M419 Scoliosis, unspecified: Secondary | ICD-10-CM | POA: Diagnosis not present

## 2015-10-24 DIAGNOSIS — M5442 Lumbago with sciatica, left side: Secondary | ICD-10-CM | POA: Insufficient documentation

## 2015-10-28 ENCOUNTER — Ambulatory Visit (INDEPENDENT_AMBULATORY_CARE_PROVIDER_SITE_OTHER): Payer: BC Managed Care – PPO | Admitting: Orthopedic Surgery

## 2015-10-28 ENCOUNTER — Encounter: Payer: Self-pay | Admitting: Orthopedic Surgery

## 2015-10-28 VITALS — BP 106/52 | HR 102 | Ht 63.0 in | Wt 125.0 lb

## 2015-10-28 DIAGNOSIS — M5410 Radiculopathy, site unspecified: Secondary | ICD-10-CM

## 2015-10-28 MED ORDER — NABUMETONE 500 MG PO TABS: 500.0000 mg | ORAL_TABLET | Freq: Every day | ORAL | Status: DC

## 2015-10-28 NOTE — Progress Notes (Signed)
Patient ID: Heidi Foley, female   DOB: 07-29-1995, 20 y.o.   MRN: [id number]  Chief Complaint  Patient presents with  . Results    MRI RESULTS    HPI follow-up visit after MRI and this 20 year old female complains of radicular pain in her left leg her MRI was normal except for scoliosis. She's been on naproxen prednisone ibuprofen Robaxin and gabapentin with no relief  ROS she denies any bowel or bladder dysfunction  BP 106/52 mmHg  Pulse 102  Ht 5\' 3"  (1.6 m)  Wt 125 lb (56.7 kg)  BMI 22.15 kg/m2  Physical Exam  Ortho Exam   MRI was reviewed with the report and agree there is no disc herniation or nerve compression after my independent review ASSESSMENT AND PLAN   Radicular leg pain  Stop gabapentin start Relafen 500 twice a day and start physical therapy one visit for back exercises (patient cannot go except on Fridays and doesn't have time in her schedule because she is in college.)  I will see her in 6 weeks

## 2015-10-28 NOTE — Addendum Note (Signed)
Addended by: Moreen Fowler R on: 10/28/2015 12:09 PM   Modules accepted: Orders

## 2015-10-28 NOTE — Patient Instructions (Addendum)
PT 1 visit for back exercises   Stop taking neurontin  Start relafen 500 twice daily   Radicular Pain Radicular pain in either the arm or leg is usually from a bulging or herniated disk in the spine. A piece of the herniated disk may press against the nerves as the nerves exit the spine. This causes pain which is felt at the tips of the nerves down the arm or leg. Other causes of radicular pain may include:  Fractures.  Heart disease.  Cancer.  An abnormal and usually degenerative state of the nervous system or nerves (neuropathy). Diagnosis may require CT or MRI scanning to determine the primary cause.  Nerves that start at the neck (nerve roots) may cause radicular pain in the outer shoulder and arm. It can spread down to the thumb and fingers. The symptoms vary depending on which nerve root has been affected. In most cases radicular pain improves with conservative treatment. Neck problems may require physical therapy, a neck collar, or cervical traction. Treatment may take many weeks, and surgery may be considered if the symptoms do not improve.  Conservative treatment is also recommended for sciatica. Sciatica causes pain to radiate from the lower back or buttock area down the leg into the foot. Often there is a history of back problems. Most patients with sciatica are better after 2 to 4 weeks of rest and other supportive care. Short term bed rest can reduce the disk pressure considerably. Sitting, however, is not a good position since this increases the pressure on the disk. You should avoid bending, lifting, and all other activities which make the problem worse. Traction can be used in severe cases. Surgery is usually reserved for patients who do not improve within the first months of treatment. Only take over-the-counter or prescription medicines for pain, discomfort, or fever as directed by your caregiver. Narcotics and muscle relaxants may help by relieving more severe pain and spasm and  by providing mild sedation. Cold or massage can give significant relief. Spinal manipulation is not recommended. It can increase the degree of disc protrusion. Epidural steroid injections are often effective treatment for radicular pain. These injections deliver medicine to the spinal nerve in the space between the protective covering of the spinal cord and back bones (vertebrae). Your caregiver can give you more information about steroid injections. These injections are most effective when given within two weeks of the onset of pain.  You should see your caregiver for follow up care as recommended. A program for neck and back injury rehabilitation with stretching and strengthening exercises is an important part of management.  SEEK IMMEDIATE MEDICAL CARE IF:  You develop increased pain, weakness, or numbness in your arm or leg.  You develop difficulty with bladder or bowel control.  You develop abdominal pain.   This information is not intended to replace advice given to you by your health care provider. Make sure you discuss any questions you have with your health care provider.   Document Released: 08/20/2004 Document Revised: 08/03/2014 Document Reviewed: 02/06/2015 Elsevier Interactive Patient Education [employer].

## 2015-11-22 ENCOUNTER — Ambulatory Visit (HOSPITAL_COMMUNITY): Payer: BC Managed Care – PPO | Admitting: Physical Therapy

## 2015-12-03 ENCOUNTER — Ambulatory Visit (HOSPITAL_COMMUNITY): Payer: BC Managed Care – PPO | Attending: Orthopedic Surgery

## 2015-12-09 ENCOUNTER — Ambulatory Visit: Payer: BC Managed Care – PPO | Admitting: Orthopedic Surgery

## 2016-01-13 ENCOUNTER — Encounter: Payer: Self-pay | Admitting: *Deleted

## 2016-01-13 ENCOUNTER — Ambulatory Visit (INDEPENDENT_AMBULATORY_CARE_PROVIDER_SITE_OTHER): Payer: BC Managed Care – PPO | Admitting: *Deleted

## 2016-01-13 DIAGNOSIS — Z3042 Encounter for surveillance of injectable contraceptive: Secondary | ICD-10-CM | POA: Diagnosis not present

## 2016-01-13 DIAGNOSIS — Z3202 Encounter for pregnancy test, result negative: Secondary | ICD-10-CM

## 2016-01-13 LAB — POCT URINE PREGNANCY: Preg Test, Ur: NEGATIVE

## 2016-01-13 MED ORDER — MEDROXYPROGESTERONE ACETATE 150 MG/ML IM SUSP
150.0000 mg | Freq: Once | INTRAMUSCULAR | Status: AC
  Administered 2016-01-13: 150 mg via INTRAMUSCULAR

## 2016-01-13 NOTE — Progress Notes (Signed)
Pt here for Depo. Pt tolerated shot well. Return in 12 weeks for next shot. [name] 

## 2016-01-16 ENCOUNTER — Emergency Department (HOSPITAL_COMMUNITY)
Admission: EM | Admit: 2016-01-16 | Discharge: 2016-01-17 | Disposition: A | Discharge status: Home / Self Care | Payer: BC Managed Care – PPO | Attending: Emergency Medicine | Admitting: Emergency Medicine

## 2016-01-16 ENCOUNTER — Other Ambulatory Visit: Payer: Self-pay

## 2016-01-16 ENCOUNTER — Encounter (HOSPITAL_COMMUNITY): Payer: Self-pay | Admitting: *Deleted

## 2016-01-16 DIAGNOSIS — J029 Acute pharyngitis, unspecified: Secondary | ICD-10-CM

## 2016-01-16 DIAGNOSIS — J4520 Mild intermittent asthma, uncomplicated: Secondary | ICD-10-CM

## 2016-01-16 DIAGNOSIS — J45909 Unspecified asthma, uncomplicated: Secondary | ICD-10-CM | POA: Insufficient documentation

## 2016-01-16 DIAGNOSIS — J069 Acute upper respiratory infection, unspecified: Secondary | ICD-10-CM | POA: Insufficient documentation

## 2016-01-16 MED ORDER — IBUPROFEN 800 MG PO TABS
800.0000 mg | ORAL_TABLET | Freq: Once | ORAL | Status: AC
  Administered 2016-01-16: 800 mg via ORAL
  Filled 2016-01-16: qty 1

## 2016-01-16 NOTE — ED Notes (Signed)
Pt c/o sore throat, headache, chest tightness, chills that started tonight before getting in shower,

## 2016-01-16 NOTE — ED Notes (Signed)
Pt c/o headache, sore throat, chest tightness

## 2016-01-17 LAB — RAPID STREP SCREEN (MED CTR MEBANE ONLY): Streptococcus, Group A Screen (Direct): NEGATIVE

## 2016-01-17 MED ORDER — PREDNISONE 20 MG PO TABS: 40.0000 mg | ORAL_TABLET | Freq: Every day | ORAL | Status: DC

## 2016-01-17 MED ORDER — ALBUTEROL SULFATE HFA 108 (90 BASE) MCG/ACT IN AERS
2.0000 | INHALATION_SPRAY | Freq: Once | RESPIRATORY_TRACT | Status: AC
  Administered 2016-01-17: 2 via RESPIRATORY_TRACT
  Filled 2016-01-17: qty 6.7

## 2016-01-17 NOTE — Discharge Instructions (Signed)
Dayquil, nyquil, zicam  You appear to have an upper respiratory infection (URI). An upper respiratory tract infection, or cold, is a viral infection of the air passages leading to the lungs. It is contagious and can be spread to others, especially during the first 3 or 4 days. It cannot be cured by antibiotics or other medicines. RETURN IMMEDIATELY IF you develop shortness of breath, confusion or altered mental status, a new rash, become dizzy, faint, or poorly responsive, or are unable to be cared for at home.   Cough, Adult Coughing is a reflex that clears your throat and your airways. Coughing helps to heal and protect your lungs. It is normal to cough occasionally, but a cough that happens with other symptoms or lasts a long time may be a sign of a condition that needs treatment. A cough may last only 2-3 weeks (acute), or it may last longer than 8 weeks (chronic). CAUSES Coughing is commonly caused by:  Breathing in substances that irritate your lungs.  A viral or bacterial respiratory infection.  Allergies.  Asthma.  Postnasal drip.  Smoking.  Acid backing up from the stomach into the esophagus (gastroesophageal reflux).  Certain medicines.  Chronic lung problems, including COPD (or rarely, lung cancer).  Other medical conditions such as heart failure. HOME CARE INSTRUCTIONS  Pay attention to any changes in your symptoms. Take these actions to help with your discomfort:  Take medicines only as told by your health care provider.  If you were prescribed an antibiotic medicine, take it as told by your health care provider. Do not stop taking the antibiotic even if you start to feel better.  Talk with your health care provider before you take a cough suppressant medicine.  Drink enough fluid to keep your urine clear or pale yellow.  If the air is dry, use a cold steam vaporizer or humidifier in your bedroom or your home to help loosen secretions.  Avoid anything that  causes you to cough at work or at home.  If your cough is worse at night, try sleeping in a semi-upright position.  Avoid cigarette smoke. If you smoke, quit smoking. If you need help quitting, ask your health care provider.  Avoid caffeine.  Avoid alcohol.  Rest as needed. SEEK MEDICAL CARE IF:   You have new symptoms.  You cough up pus.  Your cough does not get better after 2-3 weeks, or your cough gets worse.  You cannot control your cough with suppressant medicines and you are losing sleep.  You develop pain that is getting worse or pain that is not controlled with pain medicines.  You have a fever.  You have unexplained weight loss.  You have night sweats. SEEK IMMEDIATE MEDICAL CARE IF:  You cough up blood.  You have difficulty breathing.  Your heartbeat is very fast.   This information is not intended to replace advice given to you by your health care provider. Make sure you discuss any questions you have with your health care provider.   Document Released: 01/09/2011 Document Revised: 04/03/2015 Document Reviewed: 09/19/2014 Elsevier Interactive Patient Education 2016 Elsevier Inc.  Viral Infections A viral infection can be caused by different types of viruses.Most viral infections are not serious and resolve on their own. However, some infections may cause severe symptoms and may lead to further complications. SYMPTOMS Viruses can frequently cause:  Minor sore throat.  Aches and pains.  Headaches.  Runny nose.  Different types of rashes.  Watery eyes.  Tiredness.  Cough.  Loss of appetite.  Gastrointestinal infections, resulting in nausea, vomiting, and diarrhea. These symptoms do not respond to antibiotics because the infection is not caused by bacteria. However, you might catch a bacterial infection following the viral infection. This is sometimes called a "superinfection." Symptoms of such a bacterial infection may include:  Worsening  sore throat with pus and difficulty swallowing.  Swollen neck glands.  Chills and a high or persistent fever.  Severe headache.  Tenderness over the sinuses.  Persistent overall ill feeling (malaise), muscle aches, and tiredness (fatigue).  Persistent cough.  Yellow, green, or brown mucus production with coughing. HOME CARE INSTRUCTIONS   Only take over-the-counter or prescription medicines for pain, discomfort, diarrhea, or fever as directed by your caregiver.  Drink enough water and fluids to keep your urine clear or pale yellow. Sports drinks can provide valuable electrolytes, sugars, and hydration.  Get plenty of rest and maintain proper nutrition. Soups and broths with crackers or rice are fine. SEEK IMMEDIATE MEDICAL CARE IF:   You have severe headaches, shortness of breath, chest pain, neck pain, or an unusual rash.  You have uncontrolled vomiting, diarrhea, or you are unable to keep down fluids.  You or your child has an oral temperature above 102 F (38.9 C), not controlled by medicine.  Your baby is older than 3 months with a rectal temperature of 102 F (38.9 C) or higher.  Your baby is 72 months old or younger with a rectal temperature of 100.4 F (38 C) or higher. MAKE SURE YOU:   Understand these instructions.  Will watch your condition.  Will get help right away if you are not doing well or get worse.   This information is not intended to replace advice given to you by your health care provider. Make sure you discuss any questions you have with your health care provider.   Document Released: 04/22/2005 Document Revised: 10/05/2011 Document Reviewed: 12/19/2014 Elsevier Interactive Patient Education 2016 Elsevier Inc.  Sore Throat A sore throat is pain, burning, irritation, or scratchiness of the throat. There is often pain or tenderness when swallowing or talking. A sore throat may be accompanied by other symptoms, such as coughing, sneezing, fever,  and swollen neck glands. A sore throat is often the first sign of another sickness, such as a cold, flu, strep throat, or mononucleosis (commonly known as mono). Most sore throats go away without medical treatment. CAUSES  The most common causes of a sore throat include:  A viral infection, such as a cold, flu, or mono.  A bacterial infection, such as strep throat, tonsillitis, or whooping cough.  Seasonal allergies.  Dryness in the air.  Irritants, such as smoke or pollution.  Gastroesophageal reflux disease (GERD). HOME CARE INSTRUCTIONS   Only take over-the-counter medicines as directed by your caregiver.  Drink enough fluids to keep your urine clear or pale yellow.  Rest as needed.  Try using throat sprays, lozenges, or sucking on hard candy to ease any pain (if older than 4 years or as directed).  Sip warm liquids, such as broth, herbal tea, or warm water with honey to relieve pain temporarily. You may also eat or drink cold or frozen liquids such as frozen ice pops.  Gargle with salt water (mix 1 tsp salt with 8 oz of water).  Do not smoke and avoid secondhand smoke.  Put a cool-mist humidifier in your bedroom at night to moisten the air. You can also turn on a hot shower  and sit in the bathroom with the door closed for 5-10 minutes. SEEK IMMEDIATE MEDICAL CARE IF:  You have difficulty breathing.  You are unable to swallow fluids, soft foods, or your saliva.  You have increased swelling in the throat.  Your sore throat does not get better in 7 days.  You have nausea and vomiting.  You have a fever or persistent symptoms for more than 2-3 days.  You have a fever and your symptoms suddenly get worse. MAKE SURE YOU:   Understand these instructions.  Will watch your condition.  Will get help right away if you are not doing well or get worse.   This information is not intended to replace advice given to you by your health care provider. Make sure you discuss  any questions you have with your health care provider.   Document Released: 08/20/2004 Document Revised: 08/03/2014 Document Reviewed: 03/20/2012 Elsevier Interactive Patient Education 2016 Elsevier Inc. Bronchospasm, Adult A bronchospasm is a spasm or tightening of the airways going into the lungs. During a bronchospasm breathing becomes more difficult because the airways get smaller. When this happens there can be coughing, a whistling sound when breathing (wheezing), and difficulty breathing. Bronchospasm is often associated with asthma, but not all patients who experience a bronchospasm have asthma. CAUSES  A bronchospasm is caused by inflammation or irritation of the airways. The inflammation or irritation may be triggered by:   Allergies (such as to animals, pollen, food, or mold). Allergens that cause bronchospasm may cause wheezing immediately after exposure or many hours later.   Infection. Viral infections are believed to be the most common cause of bronchospasm.   Exercise.   Irritants (such as pollution, cigarette smoke, strong odors, aerosol sprays, and paint fumes).   Weather changes. Winds increase molds and pollens in the air. Rain refreshes the air by washing irritants out. Cold air may cause inflammation.   Stress and emotional upset.  SIGNS AND SYMPTOMS   Wheezing.   Excessive nighttime coughing.   Frequent or severe coughing with a simple cold.   Chest tightness.   Shortness of breath.  DIAGNOSIS  Bronchospasm is usually diagnosed through a history and physical exam. Tests, such as chest X-rays, are sometimes done to look for other conditions. TREATMENT   Inhaled medicines can be given to open up your airways and help you breathe. The medicines can be given using either an inhaler or a nebulizer machine.  Corticosteroid medicines may be given for severe bronchospasm, usually when it is associated with asthma. HOME CARE INSTRUCTIONS   Always have  a plan prepared for seeking medical care. Know when to call your health care provider and local emergency services (911 in the [place] Know where you can access local emergency care.  Only take medicines as directed by your health care provider.  If you were prescribed an inhaler or nebulizer machine, ask your health care provider to explain how to use it correctly. Always use a spacer with your inhaler if you were given one.  It is necessary to remain calm during an attack. Try to relax and breathe more slowly.  Control your home environment in the following ways:   Change your heating and air conditioning filter at least once a month.   Limit your use of fireplaces and wood stoves.  Do not smoke and do not allow smoking in your home.   Avoid exposure to perfumes and fragrances.   Get rid of pests (such as roaches and mice) and  their droppings.   Throw away plants if you see mold on them.   Keep your house clean and dust free.   Replace carpet with wood, tile, or vinyl flooring. Carpet can trap dander and dust.   Use allergy-proof pillows, mattress covers, and box spring covers.   Wash bed sheets and blankets every week in hot water and dry them in a dryer.   Use blankets that are made of polyester or cotton.   Wash hands frequently. SEEK MEDICAL CARE IF:   You have muscle aches.   You have chest pain.   The sputum changes from clear or white to yellow, green, gray, or bloody.   The sputum you cough up gets thicker.   There are problems that may be related to the medicine you are given, such as a rash, itching, swelling, or trouble breathing.  SEEK IMMEDIATE MEDICAL CARE IF:   You have worsening wheezing and coughing even after taking your prescribed medicines.   You have increased difficulty breathing.   You develop severe chest pain. MAKE SURE YOU:   Understand these instructions.  Will watch your condition.  Will get help right away if  you are not doing well or get worse.   This information is not intended to replace advice given to you by your health care provider. Make sure you discuss any questions you have with your health care provider.   Document Released: 07/16/2003 Document Revised: 08/03/2014 Document Reviewed: 01/02/2013 Elsevier Interactive Patient Education [employer].

## 2016-01-17 NOTE — ED Provider Notes (Signed)
CSN: [id number]     Arrival date & time 01/16/16  2321 History   First MD Initiated Contact with Patient 01/16/16 2337     Chief Complaint  Patient presents with  . Headache     (Consider location/radiation/quality/duration/timing/severity/associated sxs/prior Treatment) HPI 20 year old female brought in by her grandmother for chief complaint of viral illness symptoms. The patient was recently at the beach this past week with her brother and other family members. All of her family members came down with a sore throat, body aches, chills, cough. Patient states that her symptoms came on suddenly just before taking a shower this evening. She also has had associated cough and wheezing. He has a history of asthma. She is out of her current inhaler. The patient also has associated headache. She states that she sometimes gets migraine headaches and takes Motrin for her pain. She denies nausea, vomiting. She's never had strep throat previously. Past Medical History  Diagnosis Date  . Fibroids   . Asthma    Past Surgical History  Procedure Laterality Date  . Nailbed surgery      nailbed reconstruction left ring finger  . Root canal     Family History  Problem Relation Age of Onset  . Hypertension Mother   . Heart disease Other   . Cancer Other   . Diabetes Other   . Stroke Other    Social History  Substance Use Topics  . Smoking status: Never Smoker   . Smokeless tobacco: Never Used  . Alcohol Use: No   OB History    Gravida Para Term Preterm AB TAB SAB Ectopic Multiple Living   0 0 0 0 0 0 0 0 0 0      Review of Systems  Ten systems reviewed and are negative for acute change, except as noted in the HPI.    Allergies  Bee venom; Citrus; and Sulfa antibiotics  Home Medications   Prior to Admission medications   Medication Sig Start Date End Date Taking? Authorizing Provider  ibuprofen (ADVIL,MOTRIN) 800 MG tablet Take 1 tablet (800 mg total) by mouth 3 (three) times  daily. Patient taking differently: Take 800 mg by mouth as needed.  09/16/15   Carole Civil, MD  medroxyPROGESTERone (DEPO-PROVERA) 150 MG/ML injection INJECT INTRAMUSCULARLY IN THE OFFICE EVERY 3 MONTHS 10/21/15   Jonnie Kind, MD   BP 105/78 mmHg  Pulse 110  Temp(Src) 98.5 F (36.9 C) (Oral)  Resp 16  Ht 5\' 3"  (1.6 m)  Wt 55.792 kg  BMI 21.79 kg/m2  SpO2 100% Physical Exam Physical Exam  Nursing note and vitals reviewed. Constitutional: She is oriented to person, place, and time. She appears well-developed and well-nourished. No distress.  HENT:  Head: Normocephalic and atraumatic.  Eyes: Conjunctivae normal and EOM are normal. Pupils are equal, round, and reactive to light. No scleral icterus.  Neck: Normal range of motion.  Throat: mild erythema without exudates Cardiovascular: Normal rate, regular rhythm and normal heart sounds.  Exam reveals no gallop and no friction rub.   No murmur heard. Pulmonary/Chest: Effort normal and breath sounds normal. No respiratory distress.  Abdominal: Soft. Bowel sounds are normal. She exhibits no distension and no mass. There is no tenderness. There is no guarding.  Neurological: She is alert and oriented to person, place, and time.  Skin: Skin is warm and dry. She is not diaphoretic.    ED Course  Procedures (including critical care time) Labs Review Labs Reviewed  RAPID STREP SCREEN (  NOT AT [facility])    Imaging Review No results found. I have personally reviewed and evaluated these images and lab results as part of my medical decision-making.   EKG Interpretation   Date/Time:  Thursday January 16 2016 23:30:25 EDT Ventricular Rate:  104 PR Interval:    QRS Duration: 62 QT Interval:  324 QTC Calculation: 427 R Axis:   72 Text Interpretation:  Sinus tachycardia RSR' in V1 or V2, probably normal  variant Normal ECG Confirmed by POLLINA  MD, CHRISTOPHER [phone]) on  01/16/2016 11:39:25 PM      MDM   Final diagnoses:  URI  (upper respiratory infection)  Reactive airway disease, mild intermittent, uncomplicated  Sore throat    Negative strep.. Patients symptoms are consistent with URI, likely viral etiology. Discussed that antibiotics are not indicated for viral infections. Pt will be discharged with symptomatic treatment.  Verbalizes understanding and is agreeable with plan. Pt is hemodynamically stable & in NAD prior to dc.     Margarita Mail, PA-C 01/17/16 [place], MD 01/17/16 [phone]

## 2016-01-19 LAB — CULTURE, GROUP A STREP (THRC)

## 2016-04-09 ENCOUNTER — Ambulatory Visit (INDEPENDENT_AMBULATORY_CARE_PROVIDER_SITE_OTHER): Payer: BC Managed Care – PPO | Admitting: *Deleted

## 2016-04-09 ENCOUNTER — Encounter: Payer: Self-pay | Admitting: *Deleted

## 2016-04-09 DIAGNOSIS — Z3202 Encounter for pregnancy test, result negative: Secondary | ICD-10-CM

## 2016-04-09 DIAGNOSIS — Z3042 Encounter for surveillance of injectable contraceptive: Secondary | ICD-10-CM | POA: Diagnosis not present

## 2016-04-09 LAB — POCT URINE PREGNANCY: Preg Test, Ur: NEGATIVE

## 2016-04-09 MED ORDER — MEDROXYPROGESTERONE ACETATE 150 MG/ML IM SUSP
150.0000 mg | Freq: Once | INTRAMUSCULAR | Status: AC
Start: 2016-04-09 — End: 2016-04-09
  Administered 2016-04-09: 150 mg via INTRAMUSCULAR

## 2016-04-09 NOTE — Progress Notes (Signed)
Pt here for Depo. Pt tolerated shot well. Return in 12 weeks for next shot. [name] 

## 2016-05-04 ENCOUNTER — Ambulatory Visit (INDEPENDENT_AMBULATORY_CARE_PROVIDER_SITE_OTHER): Payer: BC Managed Care – PPO | Admitting: Adult Health

## 2016-05-04 ENCOUNTER — Encounter: Payer: Self-pay | Admitting: Adult Health

## 2016-05-04 VITALS — BP 88/70 | HR 72 | Ht 63.0 in | Wt 125.0 lb

## 2016-05-04 DIAGNOSIS — F3289 Other specified depressive episodes: Secondary | ICD-10-CM

## 2016-05-04 DIAGNOSIS — Z30011 Encounter for initial prescription of contraceptive pills: Secondary | ICD-10-CM

## 2016-05-04 DIAGNOSIS — Z3202 Encounter for pregnancy test, result negative: Secondary | ICD-10-CM | POA: Diagnosis not present

## 2016-05-04 LAB — POCT URINE PREGNANCY: Preg Test, Ur: NEGATIVE

## 2016-05-04 MED ORDER — NORETHIN-ETH ESTRAD-FE BIPHAS 1 MG-10 MCG / 10 MCG PO TABS: 1.0000 | ORAL_TABLET | Freq: Every day | ORAL | 4 refills | Status: DC

## 2016-05-04 NOTE — Progress Notes (Addendum)
Subjective:     Patient ID: Heidi Foley, female   DOB: 12/25/1995, 20 y.o.   MRN: [id number]  HPI Heidi Foley is a 20 year old black female in to change over from depo to to OCs,she says mom had brain tumor that was ? Associated with depo provera use, so she wants to stop it.   Review of Systems Patient denies any headaches, hearing loss, fatigue, blurred vision, shortness of breath, chest pain, abdominal pain, problems with bowel movements, urination, or intercourse. No joint pain or mood swings. Reviewed past medical,surgical, social and family history. Reviewed medications and allergies.     Objective:   Physical Exam BP (!) 88/70   Pulse 72   Ht 5\' 3"  (1.6 m)   Wt 125 lb (56.7 kg)   BMI 22.14 kg/m UPT negative,   Skin warm and dry. Lungs: clear to ausculation bilaterally. Cardiovascular: regular rate and rhythm. PHQ 9 score 8, she says she is depressed at times, but declines meds or counseling. Will D/C depo and start lo loestrin.  Assessment:     1. Encounter for initial prescription of contraceptive pills   2. Other depression   3. Pregnancy examination or test, negative result       Plan:     Rx lo loestrin disp 3 packs take 1 daily with 4 refills Use condoms Follow up in 3 months Pap at 21

## 2016-05-04 NOTE — Patient Instructions (Signed)
Start lo loestrin Sunday Use condoms  Follow up in 3 months  Pap at 21

## 2016-05-14 ENCOUNTER — Telehealth: Payer: Self-pay | Admitting: Adult Health

## 2016-05-14 NOTE — Telephone Encounter (Signed)
Pt came into the office today because she went to go take her birth control pill this morning and lost it. Pt came in to see what she can do. Spoke with Chrystal and was told to go ahead and take Friday's pill today. Pt might new an early refill of her birth control. Please contact pt

## 2016-05-14 NOTE — Telephone Encounter (Signed)
Spoke with pt letting her know to call when she gets to the end of current pack and maybe we will have a sample to give her. Pt just started her pack beginning of the week. Pt voiced understanding. [place]

## 2016-05-14 NOTE — Telephone Encounter (Signed)
Left message x 1. [name] 

## 2016-06-01 ENCOUNTER — Telehealth: Payer: Self-pay | Admitting: *Deleted

## 2016-06-01 NOTE — Telephone Encounter (Signed)
Spoke with pt letting her know we don't have any Lo Loestrin samples. Pt needs to start a new pack the end of this week. Advised to call back Wednesday or Friday to see if we have any samples and if not, pt can call pharmacy and get filled. Pt voiced understanding. [place]

## 2016-07-02 ENCOUNTER — Ambulatory Visit: Payer: BC Managed Care – PPO

## 2016-07-09 ENCOUNTER — Ambulatory Visit: Payer: BC Managed Care – PPO

## 2016-08-04 ENCOUNTER — Ambulatory Visit (INDEPENDENT_AMBULATORY_CARE_PROVIDER_SITE_OTHER): Payer: BC Managed Care – PPO | Admitting: Adult Health

## 2016-08-04 ENCOUNTER — Encounter: Payer: Self-pay | Admitting: Adult Health

## 2016-08-04 VITALS — BP 95/51 | HR 86 | Ht 64.0 in | Wt 124.0 lb

## 2016-08-04 DIAGNOSIS — F3289 Other specified depressive episodes: Secondary | ICD-10-CM | POA: Diagnosis not present

## 2016-08-04 DIAGNOSIS — Z3041 Encounter for surveillance of contraceptive pills: Secondary | ICD-10-CM

## 2016-08-04 NOTE — Progress Notes (Signed)
Subjective:     Patient ID: Genelle Bal, female   DOB: 08/21/1995, 21 y.o.   MRN: [id number]  HPI Marketta is a 21 year old black female back in follow up of starting OCs and is doing well,periods are good, but is having some increase in depression.  Review of Systems +depression  Periods good Reviewed past medical,surgical, social and family history. Reviewed medications and allergies.     Objective:   Physical Exam BP (!) 95/51 (BP Location: Left Arm, Patient Position: Sitting, Cuff Size: Normal)   Pulse 86   Ht 5\' 4"  (1.626 m)   Wt 124 lb (56.2 kg)   LMP 07/30/2016 (Approximate)   BMI 21.28 kg/m  Skin warm and dry. Lungs: clear to ausculation bilaterally. Cardiovascular: regular rate and rhythm.   PHQ 9 score 20, she is depressed at times and feels suicidal at times, but denies plan or that she would do it, she declines meds, but will go to counseling, will refer to [facility] and instructed if feelings change to go to ER. Face time 15 minutes with 50 % counseling.  Assessment:        1. Encounter for surveillance of contraceptive pills   2. Other depression    Plan:    Referred to [facility]  Continue lo loestrin Follow up 05/27/17 for pap and physical or sooner if needed

## 2017-04-07 ENCOUNTER — Telehealth: Payer: Self-pay | Admitting: *Deleted

## 2017-04-07 NOTE — Telephone Encounter (Signed)
Spoke with pt. Pt forgot to take birth control pill x 4 days. I spoke with JAG and advised pt to take 2 pills today and 2 pills tomorrow and to use back up until she starts her next period. Pt states she is bleeding now. I advised it's probably break through bleeding from missing pills and to use backup until next period. Pt wants to switch back to the shot. Pt was advised to schedule an appt. Pt voiced understanding. [place]

## 2017-04-08 ENCOUNTER — Encounter: Payer: Self-pay | Admitting: Adult Health

## 2017-04-08 ENCOUNTER — Ambulatory Visit (INDEPENDENT_AMBULATORY_CARE_PROVIDER_SITE_OTHER): Payer: BC Managed Care – PPO | Admitting: Adult Health

## 2017-04-08 VITALS — BP 110/68 | HR 79 | Ht 64.0 in | Wt 113.5 lb

## 2017-04-08 DIAGNOSIS — Z30013 Encounter for initial prescription of injectable contraceptive: Secondary | ICD-10-CM

## 2017-04-08 DIAGNOSIS — Z3202 Encounter for pregnancy test, result negative: Secondary | ICD-10-CM | POA: Diagnosis not present

## 2017-04-08 LAB — POCT URINE PREGNANCY: Preg Test, Ur: NEGATIVE

## 2017-04-08 MED ORDER — MEDROXYPROGESTERONE ACETATE 150 MG/ML IM SUSP: 150.0000 mg | INTRAMUSCULAR | 4 refills | Status: DC

## 2017-04-08 NOTE — Progress Notes (Signed)
Subjective:     Patient ID: Heidi Foley, female   DOB: 04/05/1996, 21 y.o.   MRN: [id number]  HPI Heidi Foley is a 21 year old black female in to get on depo, has been on OCs but forgets to take, at times.No sex in over a year.   Review of Systems Not depressed Has not had sex in over a year Forgets OCs  Reviewed past medical,surgical, social and family history. Reviewed medications and allergies.     Objective:   Physical Exam BP 110/68 (BP Location: Left Arm, Patient Position: Sitting, Cuff Size: Normal)   Pulse 79   Ht 5\' 4"  (1.626 m)   Wt 113 lb 8 oz (51.5 kg)   LMP 04/05/2017   BMI 19.48 kg/m UPT negative, Skin warm and dry. Lungs: clear to ausculation bilaterally. Cardiovascular: regular rate and rhythm.   PHQ 2 score 1.Declines STD testing.  Assessment:     1. Encounter for initial prescription of injectable contraceptive   2. Pregnancy examination or test, negative result       Plan:     Meds ordered this encounter  Medications  . medroxyPROGESTERone (DEPO-PROVERA) 150 MG/ML injection    Sig: Inject 1 mL (150 mg total) into the muscle every 3 (three) months.    Dispense:  1 mL    Refill:  4    Order Specific Question:   Supervising Provider    Answer:   Florian Buff [[id number]]  Return in 1 day for depo Pap and physical in November

## 2017-04-09 ENCOUNTER — Encounter: Payer: Self-pay | Admitting: *Deleted

## 2017-04-09 ENCOUNTER — Ambulatory Visit (INDEPENDENT_AMBULATORY_CARE_PROVIDER_SITE_OTHER): Payer: BC Managed Care – PPO | Admitting: *Deleted

## 2017-04-09 DIAGNOSIS — Z3202 Encounter for pregnancy test, result negative: Secondary | ICD-10-CM | POA: Diagnosis not present

## 2017-04-09 DIAGNOSIS — Z3042 Encounter for surveillance of injectable contraceptive: Secondary | ICD-10-CM

## 2017-04-09 DIAGNOSIS — Z308 Encounter for other contraceptive management: Secondary | ICD-10-CM

## 2017-04-09 LAB — POCT URINE PREGNANCY: Preg Test, Ur: NEGATIVE

## 2017-04-09 MED ORDER — MEDROXYPROGESTERONE ACETATE 150 MG/ML IM SUSP
150.0000 mg | Freq: Once | INTRAMUSCULAR | Status: AC
  Administered 2017-04-09: 150 mg via INTRAMUSCULAR

## 2017-04-09 NOTE — Progress Notes (Signed)
Pt here for Depo. Pt tolerated shot well. Return in 12 weeks for next shot. [name] 

## 2017-04-28 ENCOUNTER — Encounter: Payer: Self-pay | Admitting: *Deleted

## 2017-05-28 ENCOUNTER — Ambulatory Visit (INDEPENDENT_AMBULATORY_CARE_PROVIDER_SITE_OTHER): Payer: BC Managed Care – PPO | Admitting: Adult Health

## 2017-05-28 ENCOUNTER — Encounter: Payer: Self-pay | Admitting: Adult Health

## 2017-05-28 ENCOUNTER — Other Ambulatory Visit (HOSPITAL_COMMUNITY)
Admission: RE | Admit: 2017-05-28 | Discharge: 2017-05-28 | Disposition: A | Discharge status: Home / Self Care | Payer: BC Managed Care – PPO | Source: Ambulatory Visit | Attending: Adult Health | Admitting: Adult Health

## 2017-05-28 VITALS — BP 90/68 | HR 95 | Ht 63.5 in | Wt 114.5 lb

## 2017-05-28 DIAGNOSIS — Z01419 Encounter for gynecological examination (general) (routine) without abnormal findings: Secondary | ICD-10-CM | POA: Insufficient documentation

## 2017-05-28 DIAGNOSIS — Z3009 Encounter for other general counseling and advice on contraception: Secondary | ICD-10-CM

## 2017-05-28 DIAGNOSIS — Z3042 Encounter for surveillance of injectable contraceptive: Secondary | ICD-10-CM

## 2017-05-28 NOTE — Progress Notes (Signed)
Patient ID: Heidi Foley, female   DOB: February 25, 1996, 21 y.o.   MRN: [id number] History of Present Illness: Heidi Foley is a 21 year old black female in for well woman gyn exam and first pap.She is student at [employer].  PCP is Dr Gerarda Fraction.    Current Medications, Allergies, Past Medical History, Past Surgical History, Family History and Social History were reviewed in [place] record.     Review of Systems: Patient denies any headaches, hearing loss, fatigue, blurred vision, shortness of breath, chest pain, abdominal pain, problems with bowel movements, urination, or intercourse. No joint pain or mood swings.?vaginal odor She got depo in September.   Physical Exam:BP 90/68 (BP Location: Left Arm, Patient Position: Sitting, Cuff Size: Normal)   Pulse 95   Ht 5' 3.5" (1.613 m)   Wt 114 lb 8 oz (51.9 kg)   BMI 19.96 kg/m  General:  Well developed, well nourished, no acute distress Skin:  Warm and dry Neck:  Midline trachea, normal thyroid, good ROM, no lymphadenopathy Lungs; Clear to auscultation bilaterally Breast:  No dominant palpable mass, retraction, or nipple discharge Cardiovascular: Regular rate and rhythm Abdomen:  Soft, non tender, no hepatosplenomegaly Pelvic:  External genitalia is normal in appearance, no lesions.  The vagina is normal in appearance. Urethra has no lesions or masses. The cervix is nulliparous, pap with GC/CHL performed and reflex HPV.  Uterus is felt to be normal size, shape, and contour.  No adnexal masses or tenderness noted.Bladder is non tender, no masses felt. Extremities/musculoskeletal:  No swelling or varicosities noted, no clubbing or cyanosis Psych:  No mood changes, alert and cooperative,seems happy PHQ 2 score 1.  Impression:  1. Encounter for gynecological examination with Papanicolaou smear of cervix   2. Family planning   3. Encounter for surveillance of injectable contraceptive      Plan: Check HIV and RPR Physical in 1  year Pap in 3 if normal Depo as scheduled, has refills

## 2017-05-28 NOTE — Patient Instructions (Signed)
Physical in 1 year 

## 2017-05-29 LAB — HIV ANTIBODY (ROUTINE TESTING W REFLEX): HIV Screen 4th Generation wRfx: NONREACTIVE

## 2017-05-29 LAB — RPR: RPR Ser Ql: NONREACTIVE

## 2017-05-31 LAB — CYTOLOGY - PAP
Adequacy: ABSENT
Chlamydia: NEGATIVE
Diagnosis: NEGATIVE
Neisseria Gonorrhea: NEGATIVE

## 2017-06-18 ENCOUNTER — Other Ambulatory Visit: Payer: Self-pay | Admitting: Adult Health

## 2017-07-02 ENCOUNTER — Ambulatory Visit: Payer: BC Managed Care – PPO

## 2017-07-06 ENCOUNTER — Encounter: Payer: Self-pay | Admitting: *Deleted

## 2017-07-06 ENCOUNTER — Ambulatory Visit (INDEPENDENT_AMBULATORY_CARE_PROVIDER_SITE_OTHER): Payer: BC Managed Care – PPO | Admitting: *Deleted

## 2017-07-06 ENCOUNTER — Other Ambulatory Visit: Payer: Self-pay

## 2017-07-06 DIAGNOSIS — Z3202 Encounter for pregnancy test, result negative: Secondary | ICD-10-CM

## 2017-07-06 DIAGNOSIS — Z3042 Encounter for surveillance of injectable contraceptive: Secondary | ICD-10-CM | POA: Diagnosis not present

## 2017-07-06 LAB — POCT URINE PREGNANCY: Preg Test, Ur: NEGATIVE

## 2017-07-06 MED ORDER — MEDROXYPROGESTERONE ACETATE 150 MG/ML IM SUSP
150.0000 mg | Freq: Once | INTRAMUSCULAR | Status: AC
Start: 2017-07-06 — End: 2017-07-06
  Administered 2017-07-06: 150 mg via INTRAMUSCULAR

## 2017-07-06 NOTE — Progress Notes (Signed)
Pt given depo left vg without complications. Advised to return in 12 weeks

## 2017-09-28 ENCOUNTER — Encounter: Payer: Self-pay | Admitting: *Deleted

## 2017-09-28 ENCOUNTER — Ambulatory Visit (INDEPENDENT_AMBULATORY_CARE_PROVIDER_SITE_OTHER): Payer: Commercial Managed Care - PPO | Admitting: *Deleted

## 2017-09-28 DIAGNOSIS — Z308 Encounter for other contraceptive management: Secondary | ICD-10-CM

## 2017-09-28 DIAGNOSIS — Z3202 Encounter for pregnancy test, result negative: Secondary | ICD-10-CM

## 2017-09-28 DIAGNOSIS — Z3042 Encounter for surveillance of injectable contraceptive: Secondary | ICD-10-CM | POA: Diagnosis not present

## 2017-09-28 LAB — POCT URINE PREGNANCY: Preg Test, Ur: NEGATIVE

## 2017-09-28 MED ORDER — MEDROXYPROGESTERONE ACETATE 150 MG/ML IM SUSP
150.0000 mg | Freq: Once | INTRAMUSCULAR | Status: AC
  Administered 2017-09-28: 150 mg via INTRAMUSCULAR

## 2017-09-28 NOTE — Progress Notes (Signed)
Pt here for Depo. Pt tolerated shot well. Return in 12 weeks for next shot. [name] 

## 2017-10-20 ENCOUNTER — Encounter (HOSPITAL_COMMUNITY): Payer: Self-pay | Admitting: Emergency Medicine

## 2017-10-20 ENCOUNTER — Emergency Department (HOSPITAL_COMMUNITY)
Admission: EM | Admit: 2017-10-20 | Discharge: 2017-10-20 | Disposition: A | Discharge status: Home / Self Care | Payer: Commercial Managed Care - PPO | Attending: Emergency Medicine | Admitting: Emergency Medicine

## 2017-10-20 ENCOUNTER — Other Ambulatory Visit: Payer: Self-pay

## 2017-10-20 DIAGNOSIS — R109 Unspecified abdominal pain: Secondary | ICD-10-CM | POA: Diagnosis present

## 2017-10-20 DIAGNOSIS — R1084 Generalized abdominal pain: Secondary | ICD-10-CM

## 2017-10-20 DIAGNOSIS — J45909 Unspecified asthma, uncomplicated: Secondary | ICD-10-CM | POA: Diagnosis not present

## 2017-10-20 DIAGNOSIS — A084 Viral intestinal infection, unspecified: Secondary | ICD-10-CM

## 2017-10-20 DIAGNOSIS — Z79899 Other long term (current) drug therapy: Secondary | ICD-10-CM | POA: Insufficient documentation

## 2017-10-20 LAB — URINALYSIS, ROUTINE W REFLEX MICROSCOPIC
Bilirubin Urine: NEGATIVE
Glucose, UA: NEGATIVE mg/dL
Hgb urine dipstick: NEGATIVE
Ketones, ur: NEGATIVE mg/dL
Leukocytes, UA: NEGATIVE
Nitrite: NEGATIVE
Protein, ur: NEGATIVE mg/dL
Specific Gravity, Urine: 1.024 (ref 1.005–1.030)
pH: 5 (ref 5.0–8.0)

## 2017-10-20 LAB — PREGNANCY, URINE: Preg Test, Ur: NEGATIVE

## 2017-10-20 MED ORDER — LOPERAMIDE HCL 2 MG PO CAPS
4.0000 mg | ORAL_CAPSULE | Freq: Once | ORAL | Status: AC
  Administered 2017-10-20: 4 mg via ORAL
  Filled 2017-10-20: qty 2

## 2017-10-20 MED ORDER — DICYCLOMINE HCL 10 MG PO CAPS
10.0000 mg | ORAL_CAPSULE | Freq: Once | ORAL | Status: AC
  Administered 2017-10-20: 10 mg via ORAL
  Filled 2017-10-20: qty 1

## 2017-10-20 MED ORDER — LOPERAMIDE HCL 2 MG PO CAPS: 2.0000 mg | ORAL_CAPSULE | Freq: Four times a day (QID) | ORAL | 0 refills | Status: DC | PRN

## 2017-10-20 MED ORDER — DICYCLOMINE HCL 20 MG PO TABS: 20.0000 mg | ORAL_TABLET | Freq: Two times a day (BID) | ORAL | 0 refills | Status: DC | PRN

## 2017-10-20 NOTE — ED Provider Notes (Signed)
[facility] EMERGENCY DEPARTMENT Provider Note   CSN: [id number] Arrival date & time: 10/20/17  0308     History   Chief Complaint Chief Complaint  Patient presents with  . Abdominal Pain    HPI Heidi Foley is a 22 y.o. female.  HPI  This is a 22 year old female with history of eczema and asthma who presents with abdominal cramping and diarrhea.  Patient reports that she woke up at midnight with diffuse abdominal cramping.  She rates her pain at 10 out of 10.  It does not radiate.  She since that time has developed nonbloody diarrhea.  No vomiting.  She does endorse nausea.  No fevers.  No known sick contacts.  Denies any urinary symptoms, hematuria, dysuria, vaginal discharge.  She does not get a period as she is on Depo-Provera.  Past Medical History:  Diagnosis Date  . Asthma   . Eczema   . Fibroids   . Hypoglycemia     There are no active problems to display for this patient.   Past Surgical History:  Procedure Laterality Date  . nailbed surgery     nailbed reconstruction left ring finger  . ROOT CANAL       OB History    Gravida  0   Para  0   Term  0   Preterm  0   AB  0   Living  0     SAB  0   TAB  0   Ectopic  0   Multiple  0   Live Births               Home Medications    Prior to Admission medications   Medication Sig Start Date End Date Taking? Authorizing Provider  dicyclomine (BENTYL) 20 MG tablet Take 1 tablet (20 mg total) by mouth 2 (two) times daily as needed for spasms. 10/20/17   Shahara Hartsfield, Barbette Hair, MD  ibuprofen (ADVIL,MOTRIN) 800 MG tablet Take 1 tablet (800 mg total) by mouth 3 (three) times daily. Patient taking differently: Take 800 mg by mouth as needed.  09/16/15   Carole Civil, MD  loperamide (IMODIUM) 2 MG capsule Take 1 capsule (2 mg total) by mouth 4 (four) times daily as needed for diarrhea or loose stools. 10/20/17   Brailyn Killion, Barbette Hair, MD  medroxyPROGESTERone (DEPO-PROVERA) 150 MG/ML injection  Inject 1 mL (150 mg total) into the muscle every 3 (three) months. 04/08/17   Estill Dooms, NP    Family History Family History  Problem Relation Age of Onset  . Hypertension Mother   . Heart disease Other   . Cancer Other   . Diabetes Other   . Stroke Other   . Hypertension Maternal Grandmother   . Diabetes Maternal Grandfather   . Asthma Brother     Social History Social History   Tobacco Use  . Smoking status: Never Smoker  . Smokeless tobacco: Never Used  Substance Use Topics  . Alcohol use: No  . Drug use: No     Allergies   Bee venom; Citrus; and Sulfa antibiotics   Review of Systems Review of Systems  Constitutional: Negative for fever.  Respiratory: Negative for shortness of breath.   Cardiovascular: Negative for chest pain.  Gastrointestinal: Positive for abdominal pain, diarrhea and nausea. Negative for constipation and vomiting.  Genitourinary: Negative for dysuria and vaginal discharge.  All other systems reviewed and are negative.    Physical Exam Updated Vital Signs BP  109/81 (BP Location: Left Arm)   Pulse (!) 110   Temp 98.8 F (37.1 C) (Oral)   Resp 20   Ht 5\' 3"  (1.6 m)   Wt 54.9 kg (121 lb)   LMP 09/25/2017   SpO2 100%   BMI 21.43 kg/m   Physical Exam  Constitutional: She is oriented to person, place, and time. She appears well-developed and well-nourished. She does not appear ill.  HENT:  Head: Normocephalic and atraumatic.  Neck: Neck supple.  Cardiovascular: Normal rate, regular rhythm and normal heart sounds.  Pulmonary/Chest: Effort normal. No respiratory distress. She has no wheezes.  Abdominal: Soft. Bowel sounds are increased.  Abdomen is soft and nontender, increased bowel sounds  Neurological: She is alert and oriented to person, place, and time.  Skin: Skin is warm and dry.  Psychiatric: She has a normal mood and affect.  Nursing note and vitals reviewed.    ED Treatments / Results  Labs (all labs ordered  are listed, but only abnormal results are displayed) Labs Reviewed  URINALYSIS, [place], URINE    EKG None  Radiology No results found.  Procedures Procedures (including critical care time)  Medications Ordered in ED Medications  dicyclomine (BENTYL) capsule 10 mg (10 mg Oral Given 10/20/17 0525)  loperamide (IMODIUM) capsule 4 mg (4 mg Oral Given 10/20/17 0526)     Initial Impression / Assessment and Plan / ED Course  I have reviewed the triage vital signs and the nursing notes.  Pertinent labs & imaging results that were available during my care of the patient were reviewed by me and considered in my medical decision making (see chart for details).     Patient presents with abdominal pain and diarrhea.  She is overall nontoxic appearing on exam.  Mildly tachycardic.  Abdominal exam is benign without any focal or localized tenderness or signs of peritonitis.  Increased bowel sounds.  Suspect viral etiology.  Low suspicion at this time for appendicitis, cholecystitis, or other intra-abdominal pathology.  Patient was given Bentyl and Imodium.  On recheck, she states she feels better.  Recommend supportive measures.  After history, exam, and medical workup I feel the patient has been appropriately medically screened and is safe for discharge home. Pertinent diagnoses were discussed with the patient. Patient was given return precautions.   Final Clinical Impressions(s) / ED Diagnoses   Final diagnoses:  Viral diarrhea  Generalized abdominal pain    ED Discharge Orders        Ordered    loperamide (IMODIUM) 2 MG capsule  4 times daily PRN     10/20/17 0603    dicyclomine (BENTYL) 20 MG tablet  2 times daily PRN     10/20/17 0603       Merryl Hacker, MD 10/20/17 [phone]

## 2017-10-20 NOTE — Discharge Instructions (Addendum)
You were seen today for abdominal pain and diarrhea.  This is likely related to viral illness.  Take Imodium and Bentyl as needed.  If you have progression of symptoms, worsening of pain, or are unable to stay hydrated you need to be reevaluated.

## 2017-10-20 NOTE — ED Triage Notes (Signed)
Pt c/o lower abd pain since yesterday and states she just started having diarrhea.

## 2017-12-21 ENCOUNTER — Other Ambulatory Visit: Payer: Self-pay

## 2017-12-21 ENCOUNTER — Encounter: Payer: Self-pay | Admitting: *Deleted

## 2017-12-21 ENCOUNTER — Ambulatory Visit (INDEPENDENT_AMBULATORY_CARE_PROVIDER_SITE_OTHER): Payer: Commercial Managed Care - PPO | Admitting: *Deleted

## 2017-12-21 DIAGNOSIS — Z3042 Encounter for surveillance of injectable contraceptive: Secondary | ICD-10-CM

## 2017-12-21 DIAGNOSIS — Z3202 Encounter for pregnancy test, result negative: Secondary | ICD-10-CM | POA: Diagnosis not present

## 2017-12-21 LAB — POCT URINE PREGNANCY: Preg Test, Ur: NEGATIVE

## 2017-12-21 MED ORDER — MEDROXYPROGESTERONE ACETATE 150 MG/ML IM SUSP
150.0000 mg | Freq: Once | INTRAMUSCULAR | Status: AC
  Administered 2017-12-21: 150 mg via INTRAMUSCULAR

## 2017-12-21 NOTE — Progress Notes (Signed)
Pt given DepoProvera 150mg IM left VG without complications. Advised to return in 12 weeks for next injection.  

## 2018-03-15 ENCOUNTER — Ambulatory Visit (INDEPENDENT_AMBULATORY_CARE_PROVIDER_SITE_OTHER): Payer: Medicaid Other

## 2018-03-15 ENCOUNTER — Ambulatory Visit: Payer: Commercial Managed Care - PPO

## 2018-03-15 DIAGNOSIS — Z3202 Encounter for pregnancy test, result negative: Secondary | ICD-10-CM

## 2018-03-15 DIAGNOSIS — Z3042 Encounter for surveillance of injectable contraceptive: Secondary | ICD-10-CM | POA: Diagnosis not present

## 2018-03-15 LAB — POCT URINE PREGNANCY: Preg Test, Ur: NEGATIVE

## 2018-03-15 MED ORDER — MEDROXYPROGESTERONE ACETATE 150 MG/ML IM SUSP
150.0000 mg | Freq: Once | INTRAMUSCULAR | Status: AC
  Administered 2018-03-15: 150 mg via INTRAMUSCULAR

## 2018-03-15 NOTE — Progress Notes (Signed)
Pt here for depo injection 150 mg IM given Rt VG. Tolerated well. Return 12 weeks for next injection. Pad CMA

## 2018-06-03 ENCOUNTER — Other Ambulatory Visit: Payer: Self-pay | Admitting: Adult Health

## 2018-06-06 ENCOUNTER — Encounter: Payer: Self-pay | Admitting: *Deleted

## 2018-06-06 ENCOUNTER — Ambulatory Visit: Payer: Medicaid Other

## 2018-06-13 ENCOUNTER — Ambulatory Visit (INDEPENDENT_AMBULATORY_CARE_PROVIDER_SITE_OTHER): Payer: Medicaid Other | Admitting: Obstetrics & Gynecology

## 2018-06-13 ENCOUNTER — Encounter: Payer: Self-pay | Admitting: Obstetrics & Gynecology

## 2018-06-13 VITALS — BP 102/66 | HR 100 | Ht 63.0 in | Wt 129.0 lb

## 2018-06-13 DIAGNOSIS — Z01419 Encounter for gynecological examination (general) (routine) without abnormal findings: Secondary | ICD-10-CM

## 2018-06-13 DIAGNOSIS — Z309 Encounter for contraceptive management, unspecified: Secondary | ICD-10-CM

## 2018-06-13 DIAGNOSIS — Z3009 Encounter for other general counseling and advice on contraception: Secondary | ICD-10-CM

## 2018-06-13 MED ORDER — MEDROXYPROGESTERONE ACETATE 150 MG/ML IM SUSP: 150.0000 mg | INTRAMUSCULAR | 3 refills | Status: DC

## 2018-06-13 NOTE — Progress Notes (Signed)
Subjective:     Heidi Foley is a 22 y.o. female here for a routine exam.  No LMP recorded. Patient has had an injection. [id number] Birth Control Method:  Depo, next due 06/14/18 Menstrual Calendar(currently): not regular 2/2 depo, unsure of LMP  Current complaints: None.   Current acute medical issues:  None   Recent Gynecologic History No LMP recorded. Patient has had an injection. Last Pap: 05/28/17,  normal Last mammogram: N/A,  Patient is under age of screening. No FHx breast cancer   Past Medical History:  Diagnosis Date  . Asthma   . Eczema   . Fibroids   . Hypoglycemia     Past Surgical History:  Procedure Laterality Date  . nailbed surgery     nailbed reconstruction left ring finger  . ROOT CANAL      OB History    Gravida  0   Para  0   Term  0   Preterm  0   AB  0   Living  0     SAB  0   TAB  0   Ectopic  0   Multiple  0   Live Births              Social History   Socioeconomic History  . Marital status: Single    Spouse name: Not on file  . Number of children: Not on file  . Years of education: Not on file  . Highest education level: Not on file  Occupational History  . Not on file  Social Needs  . Financial resource strain: Not on file  . Food insecurity:    Worry: Not on file    Inability: Not on file  . Transportation needs:    Medical: Not on file    Non-medical: Not on file  Tobacco Use  . Smoking status: Never Smoker  . Smokeless tobacco: Never Used  Substance and Sexual Activity  . Alcohol use: No  . Drug use: No  . Sexual activity: Yes    Partners: Male    Birth control/protection: Injection  Lifestyle  . Physical activity:    Days per week: Not on file    Minutes per session: Not on file  . Stress: Not on file  Relationships  . Social connections:    Talks on phone: Not on file    Gets together: Not on file    Attends religious service: Not on file    Active member of club or organization: Not on file     Attends meetings of clubs or organizations: Not on file    Relationship status: Not on file  Other Topics Concern  . Not on file  Social History Narrative  . Not on file    Family History  Problem Relation Age of Onset  . Hypertension Mother   . Heart disease Other   . Cancer Other   . Diabetes Other   . Stroke Other   . Hypertension Maternal Grandmother   . Diabetes Maternal Grandfather   . Asthma Brother      Current Outpatient Medications:  .  medroxyPROGESTERone (DEPO-PROVERA) 150 MG/ML injection, ADMINISTER 1 ML(150 MG) IN THE MUSCLE EVERY 3 MONTHS, Disp: 1 mL, Rfl: 4 .  ibuprofen (ADVIL,MOTRIN) 800 MG tablet, Take 1 tablet (800 mg total) by mouth 3 (three) times daily. (Patient not taking: Reported on 06/13/2018), Disp: 90 tablet, Rfl: 0  Review of Systems  Review of Systems  Constitutional: Negative for  fever, chills, weight loss, malaise/fatigue and diaphoresis.  HENT: Negative for hearing loss, ear pain, nosebleeds, congestion, sore throat, neck pain, tinnitus and ear discharge.   Eyes: Negative for blurred vision, double vision, photophobia, pain, discharge and redness.  Respiratory: Negative for cough, hemoptysis, sputum production, shortness of breath, wheezing and stridor.   Cardiovascular: Negative for chest pain, palpitations, orthopnea, claudication, leg swelling and PND.  Gastrointestinal: negative for abdominal pain. Negative for heartburn, nausea, vomiting, diarrhea, constipation, blood in stool and melena.  Genitourinary: Negative for dysuria, urgency, frequency, hematuria and flank pain.  Musculoskeletal: Negative for myalgias, back pain, joint pain and falls.  Skin: Negative for itching and rash.  Neurological: Negative for dizziness, tingling, tremors, sensory change, speech change, focal weakness, seizures, loss of consciousness, weakness and headaches.  Endo/Heme/Allergies: Negative for environmental allergies and polydipsia. Does not bruise/bleed  easily.  Psychiatric/Behavioral: Negative for depression, suicidal ideas, hallucinations, memory loss and substance abuse. The patient is not nervous/anxious and does not have insomnia.        Objective:  Blood pressure 102/66, pulse 100, height 5\' 3"  (1.6 m), weight 129 lb (58.5 kg).   Physical Exam  Vitals reviewed. Constitutional: She is oriented to person, place, and time. She appears well-developed and well-nourished.  HENT: moist mucous membranes  Head: Normocephalic and atraumatic.        Right Ear: External ear normal.  Left Ear: External ear normal.  Nose: Nose normal.  Mouth/Throat: Oropharynx is clear and moist.  Eyes: Conjunctivae and EOM are normal. Pupils are equal, round, and reactive to light. Right eye exhibits no discharge. Left eye exhibits no discharge. No scleral icterus.  Neck: Normal range of motion. Neck supple. No tracheal deviation present. No thyromegaly present.  Cardiovascular: Normal rate, regular rhythm, normal heart sounds and intact distal pulses.  Exam reveals no gallop and no friction rub.   No murmur heard. Respiratory: Effort normal and breath sounds normal. No respiratory distress. She has no wheezes. She has no rales. She exhibits no tenderness.  GI: Soft. Bowel sounds are normal. She exhibits no distension and no mass. There is no tenderness. There is no rebound and no guarding.  Genitourinary:  Breasts no masses skin changes or nipple changes bilaterally      Vulva is normal without lesions Vagina is pink moist without discharge Cervix normal in appearance and pap is not done Uterus is normal size shape and contour Adnexa is negative with normal sized ovaries  Musculoskeletal: Normal range of motion. She exhibits no edema. Some right lumbar MSK tenderness (patient has f/u with PCP) Neurological: She is alert and oriented to person, place, and time. She has normal reflexes. She displays normal reflexes. No cranial nerve deficit. She exhibits  normal muscle tone. Coordination normal.  Skin: Skin is warm and dry. No rash noted. No erythema. No pallor.  Psychiatric: She has a normal mood and affect. Her behavior is normal. Judgment and thought content normal.       Medications Ordered at today's visit: No orders of the defined types were placed in this encounter.   Other orders placed at today's visit: Orders Placed This Encounter  Procedures  . GC/Chlamydia Probe Amp  . HIV antibody (with reflex)  . RPR      Assessment:    Healthy female exam.    Plan:    Education reviewed: low fat, low cholesterol diet and safe sex/STD prevention. Contraception: Depo-Provera injections and will obtain GC/Chlamydia as well as HIV/RPR today. Follow up in:  1 year.     Return in about 1 year (around 06/14/2019).

## 2018-06-13 NOTE — Addendum Note (Signed)
Addended by: Florian Buff on: 06/13/2018 04:45 PM   Modules accepted: Orders

## 2018-06-14 ENCOUNTER — Encounter: Payer: Self-pay | Admitting: *Deleted

## 2018-06-14 ENCOUNTER — Ambulatory Visit (INDEPENDENT_AMBULATORY_CARE_PROVIDER_SITE_OTHER): Payer: Medicaid Other | Admitting: *Deleted

## 2018-06-14 DIAGNOSIS — Z3202 Encounter for pregnancy test, result negative: Secondary | ICD-10-CM

## 2018-06-14 DIAGNOSIS — Z3042 Encounter for surveillance of injectable contraceptive: Secondary | ICD-10-CM | POA: Diagnosis not present

## 2018-06-14 LAB — RPR: RPR Ser Ql: NONREACTIVE

## 2018-06-14 LAB — POCT URINE PREGNANCY: Preg Test, Ur: NEGATIVE

## 2018-06-14 LAB — HIV ANTIBODY (ROUTINE TESTING W REFLEX): HIV Screen 4th Generation wRfx: NONREACTIVE

## 2018-06-14 MED ORDER — MEDROXYPROGESTERONE ACETATE 150 MG/ML IM SUSP
150.0000 mg | Freq: Once | INTRAMUSCULAR | Status: AC
  Administered 2018-06-14: 150 mg via INTRAMUSCULAR

## 2018-06-14 NOTE — Progress Notes (Signed)
Pt given DepoProvera 150mg  IM left VG without complications. Advised to return in 12 weeks for next injection.

## 2018-06-15 LAB — GC/CHLAMYDIA PROBE AMP
Chlamydia trachomatis, NAA: NEGATIVE
Neisseria gonorrhoeae by PCR: NEGATIVE

## 2018-07-27 ENCOUNTER — Encounter (HOSPITAL_COMMUNITY): Payer: Self-pay | Admitting: Emergency Medicine

## 2018-07-27 ENCOUNTER — Emergency Department (HOSPITAL_COMMUNITY): Payer: BLUE CROSS/BLUE SHIELD

## 2018-07-27 ENCOUNTER — Emergency Department (HOSPITAL_COMMUNITY)
Admission: EM | Admit: 2018-07-27 | Discharge: 2018-07-27 | Disposition: A | Discharge status: Home / Self Care | Payer: BLUE CROSS/BLUE SHIELD | Attending: Emergency Medicine | Admitting: Emergency Medicine

## 2018-07-27 ENCOUNTER — Other Ambulatory Visit: Payer: Self-pay

## 2018-07-27 DIAGNOSIS — J101 Influenza due to other identified influenza virus with other respiratory manifestations: Secondary | ICD-10-CM

## 2018-07-27 DIAGNOSIS — J45909 Unspecified asthma, uncomplicated: Secondary | ICD-10-CM | POA: Diagnosis not present

## 2018-07-27 DIAGNOSIS — R05 Cough: Secondary | ICD-10-CM | POA: Diagnosis present

## 2018-07-27 LAB — POC URINE PREG, ED: Preg Test, Ur: NEGATIVE

## 2018-07-27 LAB — INFLUENZA PANEL BY PCR (TYPE A & B)
Influenza A By PCR: NEGATIVE
Influenza B By PCR: POSITIVE — AB

## 2018-07-27 LAB — GROUP A STREP BY PCR: Group A Strep by PCR: NOT DETECTED

## 2018-07-27 MED ORDER — BENZONATATE 100 MG PO CAPS: 100.0000 mg | ORAL_CAPSULE | Freq: Three times a day (TID) | ORAL | 0 refills | Status: DC

## 2018-07-27 MED ORDER — LIDOCAINE VISCOUS HCL 2 % MT SOLN: 15.0000 mL | OROMUCOSAL | 0 refills | Status: DC | PRN

## 2018-07-27 MED ORDER — ACETAMINOPHEN 325 MG PO TABS
650.0000 mg | ORAL_TABLET | Freq: Once | ORAL | Status: AC
  Administered 2018-07-27: 650 mg via ORAL
  Filled 2018-07-27: qty 2

## 2018-07-27 NOTE — ED Triage Notes (Signed)
Pt states that she has body aches congestion cough and sore throat

## 2018-07-27 NOTE — ED Provider Notes (Signed)
[facility] EMERGENCY DEPARTMENT Provider Note   CSN: [id number] Arrival date & time: 07/27/18  0844     History   Chief Complaint Chief Complaint  Patient presents with  . Influenza    HPI Heidi Foley is a 23 y.o. female presenting today for 1 week of rhinorrhea, congestion, cough, subjective fever and body aches.  Patient states that all of her symptoms began 1 week ago and have been gradually progressing since that time.  Patient describes her body aches as a generalized aching sensation that is constant and worse with movement, somewhat improved with NyQuil taken last night.  Patient denies focal area of pain.  Patient endorses sore throat, bilateral, mild burning sensation that is constant and worsened with swallowing.  She denies difficulty swallowing, drooling, feeling of throat closing or difficulty breathing.  Patient describes her cough as a moderate, intermittent cough with clear sputum.  Patient states that after the first 4 days of coughing she began developing mild chest wall pain 3 days ago, states that she only has pain with her cough or with palpation of the chest.  She denies pain at rest or pain with breathing.  Patient denies history of blood clot, hemoptysis, recent trauma/surgery, immobilization, history of cancer, extremity swelling, shortness of breath or estrogen use.  HPI  Past Medical History:  Diagnosis Date  . Asthma   . Eczema   . Fibroids   . Hypoglycemia     There are no active problems to display for this patient.   Past Surgical History:  Procedure Laterality Date  . nailbed surgery     nailbed reconstruction left ring finger  . ROOT CANAL       OB History    Gravida  0   Para  0   Term  0   Preterm  0   AB  0   Living  0     SAB  0   TAB  0   Ectopic  0   Multiple  0   Live Births               Home Medications    Prior to Admission medications   Medication Sig Start Date End Date Taking? Authorizing  Provider  DM-Doxylamine-Acetaminophen (NYQUIL COLD & FLU PO) Take 2 capsules by mouth 2 (two) times daily.   Yes [provider]  medroxyPROGESTERone (DEPO-PROVERA) 150 MG/ML injection Inject 1 mL (150 mg total) into the muscle every 3 (three) months. 06/13/18  Yes Florian Buff, MD  benzonatate (TESSALON) 100 MG capsule Take 1 capsule (100 mg total) by mouth every 8 (eight) hours. 07/27/18   Nuala Alpha A, PA-C  ibuprofen (ADVIL,MOTRIN) 800 MG tablet Take 1 tablet (800 mg total) by mouth 3 (three) times daily. Patient not taking: Reported on 07/27/2018 09/16/15   Carole Civil, MD  lidocaine (XYLOCAINE) 2 % solution Use as directed 15 mLs in the mouth or throat as needed for mouth pain (Do NOT swallow). 07/27/18   Deliah Boston, PA-C    Family History Family History  Problem Relation Age of Onset  . Hypertension Mother   . Heart disease Other   . Cancer Other   . Diabetes Other   . Stroke Other   . Hypertension Maternal Grandmother   . Diabetes Maternal Grandfather   . Asthma Brother     Social History Social History   Tobacco Use  . Smoking status: Never Smoker  . Smokeless tobacco: Never  Used  Substance Use Topics  . Alcohol use: No  . Drug use: No     Allergies   Bee venom; Citrus; and Sulfa antibiotics   Review of Systems Review of Systems  Constitutional: Positive for fever (Subjective). Negative for chills.  HENT: Positive for congestion, rhinorrhea and sore throat. Negative for drooling, facial swelling, trouble swallowing and voice change.   Eyes: Negative.  Negative for visual disturbance.  Respiratory: Positive for cough. Negative for shortness of breath.   Cardiovascular: Positive for chest pain (Chest wall pain only with cough and palpation).  Gastrointestinal: Negative.  Negative for abdominal pain, diarrhea, nausea and vomiting.  Genitourinary: Negative.  Negative for dysuria and hematuria.  Musculoskeletal: Positive for arthralgias and  myalgias. Negative for neck pain and neck stiffness.  Skin: Negative.  Negative for rash.  Neurological: Negative.  Negative for dizziness, syncope, weakness and headaches.   Physical Exam Updated Vital Signs BP 119/72 (BP Location: Right Arm)   Pulse (!) 107   Temp 99.7 F (37.6 C) (Oral)   Resp 16   Ht 5\' 3"  (1.6 m)   Wt 58.1 kg   SpO2 99%   BMI 22.67 kg/m   Physical Exam Constitutional:      General: She is not in acute distress.    Appearance: Normal appearance. She is well-developed. She is not ill-appearing or diaphoretic.  HENT:     Head: Normocephalic and atraumatic.     Right Ear: Tympanic membrane, ear canal and external ear normal.     Left Ear: Tympanic membrane, ear canal and external ear normal.     Nose: Congestion and rhinorrhea present. Rhinorrhea is clear.     Right Sinus: No maxillary sinus tenderness or frontal sinus tenderness.     Left Sinus: No maxillary sinus tenderness or frontal sinus tenderness.     Mouth/Throat:     Lips: Pink.     Mouth: Mucous membranes are moist.     Pharynx: Oropharynx is clear. Uvula midline.     Comments: The patient has normal phonation and is in control of secretions. No stridor.  Midline uvula without edema. Soft palate rises symmetrically. No tonsillar erythema, swelling or exudates. Tongue protrusion is normal, floor of mouth is soft. No trismus. No creptius on neck palpation. No gingival erythema or fluctuance noted. Mucus membranes moist. Eyes:     General: Vision grossly intact. Gaze aligned appropriately.     Extraocular Movements: Extraocular movements intact.     Conjunctiva/sclera: Conjunctivae normal.     Pupils: Pupils are equal, round, and reactive to light.  Neck:     Musculoskeletal: Full passive range of motion without pain, normal range of motion and neck supple. No edema, erythema or neck rigidity.     Trachea: Trachea and phonation normal. No tracheal deviation.  Cardiovascular:     Rate and Rhythm:  Normal rate and regular rhythm.     Pulses: Normal pulses.          Dorsalis pedis pulses are 2+ on the right side and 2+ on the left side.       Posterior tibial pulses are 2+ on the right side and 2+ on the left side.     Heart sounds: Normal heart sounds.  Pulmonary:     Effort: Pulmonary effort is normal. No respiratory distress.     Breath sounds: Normal breath sounds and air entry. No decreased breath sounds.  Chest:     Chest wall: Tenderness present. No deformity or  crepitus.  Abdominal:     Palpations: Abdomen is soft.     Tenderness: There is no abdominal tenderness. There is no guarding or rebound.  Musculoskeletal: Normal range of motion.     Right lower leg: Normal. No edema.     Left lower leg: Normal. No edema.  Feet:     Right foot:     Protective Sensation: 3 sites tested. 3 sites sensed.     Left foot:     Protective Sensation: 3 sites tested. 3 sites sensed.  Skin:    General: Skin is warm and dry.     Capillary Refill: Capillary refill takes less than 2 seconds.  Neurological:     General: No focal deficit present.     Mental Status: She is alert.     GCS: GCS eye subscore is 4. GCS verbal subscore is 5. GCS motor subscore is 6.     Comments: Speech is clear and goal oriented, follows commands Major Cranial nerves without deficit, no facial droop Normal strength in upper and lower extremities bilaterally including dorsiflexion and plantar flexion, strong and equal grip strength Sensation normal to light touch Moves extremities without ataxia, coordination intact Normal gait  Psychiatric:        Mood and Affect: Mood normal.        Behavior: Behavior normal.    ED Treatments / Results  Labs (all labs ordered are listed, but only abnormal results are displayed) Labs Reviewed  INFLUENZA PANEL BY PCR (TYPE A & B) - Abnormal; Notable for the following components:      Result Value   Influenza B By PCR POSITIVE (*)    All other components within normal  limits  GROUP A STREP BY PCR  POC URINE PREG, ED    EKG None  Radiology Dg Chest 2 View  Result Date: 07/27/2018 CLINICAL DATA:  Cough. EXAM: CHEST - 2 VIEW COMPARISON:  CT scan of Dec 16, 2011. FINDINGS: The heart size and mediastinal contours are within normal limits. Both lungs are clear. The visualized skeletal structures are unremarkable. IMPRESSION: No active cardiopulmonary disease. Electronically Signed   By: Marijo Conception, M.D.   On: 07/27/2018 09:53    Procedures Procedures (including critical care time)  Medications Ordered in ED Medications  acetaminophen (TYLENOL) tablet 650 mg (650 mg Oral Given 07/27/18 0955)     Initial Impression / Assessment and Plan / ED Course  I have reviewed the triage vital signs and the nursing notes.  Pertinent labs & imaging results that were available during my care of the patient were reviewed by me and considered in my medical decision making (see chart for details).    23 year old otherwise healthy female presenting today for 1 week of flulike symptoms.  Influenza B positive Strep test negative Urine pregnancy negative Chest x-ray negative  Tylenol given Vital signs within normal limits  Patient is afebrile without tonsillar exudate, negative strep. Patient does not appear dehydrated, but did discuss importance of water rehydration. Presentation non-concerning for peritonsillar abscess, Ludwig's angina, retropharyngeal abscess or other deep tissue infections of the head/neck. Patient is without trismus or uvula deviation.  Patient is able to drink water in ED without difficulty with intact air way. Patient with mild productive cough and chest wall tenderness to palpation, no chest pain at rest.  Patient is PERC negative.  Doubt ACS/PE or other cardiopulmonary etiologies of symptoms today.  Suspect musculoskeletal pain due to patient's 1 week of coughing.  Patient  is out of the window for Tamiflu.  Vitals are stable.  No signs  of dehydration, tolerating PO's.  Lungs are clear. Patient will be discharged with instructions to orally hydrate, rest, and use over-the-counter medications such as anti-inflammatories ibuprofen and Aleve for muscle aches and Tylenol for fever.  Patient also was prescribed Tessalon for cough.  Patient prescribed viscous lidocaine for sore throat, informed not to swallow this medication.  At this time there does not appear to be any evidence of an acute emergency medical condition and the patient appears stable for discharge with appropriate outpatient follow up. Diagnosis was discussed with patient who verbalizes understanding of care plan and is agreeable to discharge. I have discussed return precautions with patient and mother who verbalize understanding of return precautions. Patient strongly encouraged to follow-up with their PCP this week. All questions answered.  Patient's case discussed with Dr. Rogene Houston who agrees with plan to discharge with follow-up.   Note: Portions of this report may have been transcribed using voice recognition software. Every effort was made to ensure accuracy; however, inadvertent computerized transcription errors may still be present. Final Clinical Impressions(s) / ED Diagnoses   Final diagnoses:  Influenza B    ED Discharge Orders         Ordered    benzonatate (TESSALON) 100 MG capsule  Every 8 hours,   Status:  Discontinued     07/27/18 1056    lidocaine (XYLOCAINE) 2 % solution  As needed     07/27/18 1107    benzonatate (TESSALON) 100 MG capsule  Every 8 hours     07/27/18 1115           Gari Crown 07/27/18 1126    Fredia Sorrow, MD 07/27/18 1621

## 2018-07-27 NOTE — Discharge Instructions (Addendum)
You have been diagnosed today with Influenza B.   At this time there does not appear to be the presence of an emergent medical condition, however there is always the potential for conditions to change. Please read and follow the below instructions.  Please return to the Emergency Department immediately for any new or worsening symptoms or if your symptoms do not improve within 3 days. Please be sure to follow up with your Primary Care Provider this week regarding your visit today; please call their office to schedule an appointment even if you are feeling better for a follow-up visit. Go to your appointment tomorrow. Please drink plenty water and get plenty of rest to help with your symptoms.  You may use over-the-counter anti-inflammatories such as Tylenol/ibuprofen to help with your symptoms as directed on the packaging. You may use the cough medicine Tessalon as prescribed to help with your cough. You may use the Viscous Lidocaine solution as prescribed to help with your sore throat. Do not swallow this medication.  Get help right away if: You develop shortness of breath or difficulty breathing. Your skin or nails turn a bluish color. You have severe pain or stiffness in your neck. You develop a sudden headache or sudden pain in your face or ear. You cannot eat or drink without vomiting. You develop new symptoms. You have: Chest pain. Diarrhea. A fever. Your cough gets worse. You produce more mucus. You feel nauseous or you vomit.   Please read the additional information packets attached to your discharge summary.  Do not take your medicine if  develop an itchy rash, swelling in your mouth or lips, or difficulty breathing.

## 2018-09-07 ENCOUNTER — Encounter: Payer: Self-pay | Admitting: *Deleted

## 2018-09-07 ENCOUNTER — Ambulatory Visit (INDEPENDENT_AMBULATORY_CARE_PROVIDER_SITE_OTHER): Payer: BC Managed Care – PPO | Admitting: *Deleted

## 2018-09-07 DIAGNOSIS — Z3202 Encounter for pregnancy test, result negative: Secondary | ICD-10-CM | POA: Diagnosis not present

## 2018-09-07 DIAGNOSIS — Z3042 Encounter for surveillance of injectable contraceptive: Secondary | ICD-10-CM

## 2018-09-07 DIAGNOSIS — Z308 Encounter for other contraceptive management: Secondary | ICD-10-CM

## 2018-09-07 LAB — POCT URINE PREGNANCY: Preg Test, Ur: NEGATIVE

## 2018-09-07 MED ORDER — MEDROXYPROGESTERONE ACETATE 150 MG/ML IM SUSP
150.0000 mg | Freq: Once | INTRAMUSCULAR | Status: AC
  Administered 2018-09-07: 150 mg via INTRAMUSCULAR

## 2018-09-07 NOTE — Progress Notes (Signed)
Pt here for Depo. Pt received shot in right hip. Pt tolerated shot well. Return in 12 weeks for next shot. Lem[place]

## 2018-09-09 ENCOUNTER — Other Ambulatory Visit (HOSPITAL_COMMUNITY): Payer: Self-pay | Admitting: Family Medicine

## 2018-09-09 ENCOUNTER — Ambulatory Visit (HOSPITAL_COMMUNITY)
Admission: RE | Admit: 2018-09-09 | Discharge: 2018-09-09 | Disposition: A | Discharge status: Home / Self Care | Payer: BC Managed Care – PPO | Source: Ambulatory Visit | Attending: Family Medicine | Admitting: Family Medicine

## 2018-09-09 DIAGNOSIS — M545 Low back pain, unspecified: Secondary | ICD-10-CM

## 2018-09-20 ENCOUNTER — Encounter (HOSPITAL_COMMUNITY): Payer: Self-pay | Admitting: Emergency Medicine

## 2018-09-20 ENCOUNTER — Ambulatory Visit (HOSPITAL_COMMUNITY)
Admission: EM | Admit: 2018-09-20 | Discharge: 2018-09-20 | Disposition: A | Discharge status: Home / Self Care | Payer: BC Managed Care – PPO | Attending: Family Medicine | Admitting: Family Medicine

## 2018-09-20 DIAGNOSIS — B349 Viral infection, unspecified: Secondary | ICD-10-CM

## 2018-09-20 DIAGNOSIS — R5383 Other fatigue: Secondary | ICD-10-CM | POA: Diagnosis not present

## 2018-09-20 DIAGNOSIS — M7918 Myalgia, other site: Secondary | ICD-10-CM

## 2018-09-20 DIAGNOSIS — R0981 Nasal congestion: Secondary | ICD-10-CM | POA: Diagnosis not present

## 2018-09-20 DIAGNOSIS — R52 Pain, unspecified: Secondary | ICD-10-CM

## 2018-09-20 NOTE — ED Triage Notes (Signed)
Pt c/o generalized body aches and congestion x3 days.

## 2018-09-20 NOTE — ED Provider Notes (Signed)
[place]   [id number] 09/20/18 Arrival Time: 4098  ASSESSMENT & PLAN:  1. Body aches   2. Viral illness     See AVS for discharge instructions.  No orders of the defined types were placed in this encounter.   Cough medication sedation precautions. Discussed typical duration of symptoms. OTC symptom care as needed. Ensure adequate fluid intake and rest. May f/u with PCP or here as needed.  Reviewed expectations re: course of current medical issues. Questions answered. Outlined signs and symptoms indicating need for more acute intervention. Patient verbalized understanding. After Visit Summary given.   SUBJECTIVE: History from: patient.  Louan SIERRIA BRUNEY is a 23 y.o. female who presents with complaint of generalized body aches and mild nasal congestion; without sore throat. Onset abrupt, between 2 and 3 days ago; with mild fatigue. No coughing. SOB: none. Wheezing: none. Fever: no. Overall normal PO intake without n/v. Known sick contacts: no. No specific or significant aggravating or alleviating factors reported. OTC treatment: none reported.  Social History   Tobacco Use  Smoking Status Never Smoker  Smokeless Tobacco Never Used    ROS: As per HPI.   OBJECTIVE:  Vitals:   09/20/18 1051  BP: 119/72  Pulse: 72  Resp: 18  Temp: 98.1 F (36.7 C)  SpO2: 100%     General appearance: alert; appears fatigued HEENT: nasal congestion; throat normal Neck: supple without LAD CV: RRR Lungs: unlabored respirations, symmetrical air entry without wheezing; cough: absent Abd: soft; non-tender Ext: no LE edema Skin: warm and dry Psychological: alert and cooperative; normal mood and affect   Allergies  Allergen Reactions  . Bee Venom Anaphylaxis  . Citrus Swelling  . Sulfa Antibiotics     Past Medical History:  Diagnosis Date  . Asthma   . Eczema   . Fibroids   . Hypoglycemia    Family History  Problem Relation Age of Onset  . Hypertension  Mother   . Heart disease Other   . Cancer Other   . Diabetes Other   . Stroke Other   . Hypertension Maternal Grandmother   . Diabetes Maternal Grandfather   . Asthma Brother    Social History   Socioeconomic History  . Marital status: Single    Spouse name: Not on file  . Number of children: Not on file  . Years of education: Not on file  . Highest education level: Not on file  Occupational History  . Not on file  Social Needs  . Financial resource strain: Not on file  . Food insecurity:    Worry: Not on file    Inability: Not on file  . Transportation needs:    Medical: Not on file    Non-medical: Not on file  Tobacco Use  . Smoking status: Never Smoker  . Smokeless tobacco: Never Used  Substance and Sexual Activity  . Alcohol use: No  . Drug use: No  . Sexual activity: Yes    Partners: Male    Birth control/protection: Injection  Lifestyle  . Physical activity:    Days per week: Not on file    Minutes per session: Not on file  . Stress: Not on file  Relationships  . Social connections:    Talks on phone: Not on file    Gets together: Not on file    Attends religious service: Not on file    Active member of club or organization: Not on file    Attends meetings of clubs  or organizations: Not on file    Relationship status: Not on file  . Intimate partner violence:    Fear of current or ex partner: Not on file    Emotionally abused: Not on file    Physically abused: Not on file    Forced sexual activity: Not on file  Other Topics Concern  . Not on file  Social History Narrative  . Not on file           Vanessa Kick, MD 09/20/18 1110

## 2018-11-30 ENCOUNTER — Other Ambulatory Visit: Payer: Self-pay

## 2018-11-30 ENCOUNTER — Encounter: Payer: Self-pay | Admitting: *Deleted

## 2018-11-30 ENCOUNTER — Ambulatory Visit (INDEPENDENT_AMBULATORY_CARE_PROVIDER_SITE_OTHER): Payer: BLUE CROSS/BLUE SHIELD | Admitting: *Deleted

## 2018-11-30 DIAGNOSIS — Z3042 Encounter for surveillance of injectable contraceptive: Secondary | ICD-10-CM | POA: Diagnosis not present

## 2018-11-30 MED ORDER — MEDROXYPROGESTERONE ACETATE 150 MG/ML IM SUSP
150.0000 mg | Freq: Once | INTRAMUSCULAR | Status: AC
  Administered 2018-11-30: 11:00:00 150 mg via INTRAMUSCULAR

## 2018-11-30 NOTE — Progress Notes (Signed)
Depo Provera 150mg  IM given in left deltoid with no complications.  Pt to return in 12 weeks for next injection.

## 2019-02-22 ENCOUNTER — Other Ambulatory Visit: Payer: Self-pay

## 2019-02-22 ENCOUNTER — Ambulatory Visit: Payer: BLUE CROSS/BLUE SHIELD

## 2019-02-22 ENCOUNTER — Ambulatory Visit (INDEPENDENT_AMBULATORY_CARE_PROVIDER_SITE_OTHER): Payer: BLUE CROSS/BLUE SHIELD | Admitting: *Deleted

## 2019-02-22 DIAGNOSIS — Z3042 Encounter for surveillance of injectable contraceptive: Secondary | ICD-10-CM | POA: Diagnosis not present

## 2019-02-22 MED ORDER — MEDROXYPROGESTERONE ACETATE 150 MG/ML IM SUSP
150.0000 mg | Freq: Once | INTRAMUSCULAR | Status: AC
  Administered 2019-02-22: 150 mg via INTRAMUSCULAR

## 2019-02-22 NOTE — Progress Notes (Signed)
Depo Provera 150  Mg given IM in right deltoid. Patient tolerated well. Next dose in 12 weeks.

## 2019-05-10 ENCOUNTER — Ambulatory Visit: Payer: BLUE CROSS/BLUE SHIELD

## 2019-05-15 ENCOUNTER — Ambulatory Visit (INDEPENDENT_AMBULATORY_CARE_PROVIDER_SITE_OTHER): Payer: Medicaid Other

## 2019-05-15 ENCOUNTER — Other Ambulatory Visit: Payer: Self-pay

## 2019-05-15 DIAGNOSIS — Z3042 Encounter for surveillance of injectable contraceptive: Secondary | ICD-10-CM

## 2019-05-15 MED ORDER — MEDROXYPROGESTERONE ACETATE 150 MG/ML IM SUSP
150.0000 mg | Freq: Once | INTRAMUSCULAR | Status: AC
  Administered 2019-05-15: 150 mg via INTRAMUSCULAR

## 2019-05-15 NOTE — Progress Notes (Signed)
   NURSE VISIT- INJECTION  SUBJECTIVE:  Heidi Foley is a 23 y.o. G0P0000 female here for a Depo Provera   . She is a GYN patient.   OBJECTIVE:  There were no vitals taken for this visit.  Appears well, in no apparent distress  Injection administered left deltoid  Meds ordered this encounter  Medications  . medroxyPROGESTERone (DEPO-PROVERA) injection 150 mg    ASSESSMENT: GYN patient Depo Provera for contraception/period management    PLAN: Follow-up: in 11-13 weeks for next Depo   Ladonna Snide  05/15/2019 4:33 PM

## 2019-08-07 ENCOUNTER — Ambulatory Visit: Payer: Medicaid Other

## 2019-12-01 ENCOUNTER — Other Ambulatory Visit: Payer: Self-pay

## 2019-12-01 ENCOUNTER — Ambulatory Visit
Admission: EM | Admit: 2019-12-01 | Discharge: 2019-12-01 | Disposition: A | Discharge status: Home / Self Care | Payer: BC Managed Care – PPO | Attending: Emergency Medicine | Admitting: Emergency Medicine

## 2019-12-01 DIAGNOSIS — J069 Acute upper respiratory infection, unspecified: Secondary | ICD-10-CM

## 2019-12-01 DIAGNOSIS — H6981 Other specified disorders of Eustachian tube, right ear: Secondary | ICD-10-CM

## 2019-12-01 MED ORDER — MUCINEX 600 MG PO TB12: 1200.0000 mg | ORAL_TABLET | Freq: Two times a day (BID) | ORAL | 0 refills | Status: DC | PRN

## 2019-12-01 MED ORDER — IBUPROFEN 600 MG PO TABS: 600.0000 mg | ORAL_TABLET | Freq: Four times a day (QID) | ORAL | 0 refills | Status: DC | PRN

## 2019-12-01 NOTE — ED Triage Notes (Signed)
Patient complains of right ear pain that started a few days ago. Patient states that pain was originally in right sinus cavity and has since settled in her right ear.

## 2019-12-01 NOTE — ED Provider Notes (Signed)
Heidi Foley    CSN: [id number] Arrival date & time: 12/01/19  1342      History   Chief Complaint Chief Complaint  Patient presents with  . Otalgia    HPI Heidi Foley is a 24 y.o. female.   Patient presents with post nasal drip, sinus congestion and right ear pain x2 days.  She denies fever, chills, sore throat, cough, shortness of breath, vomiting, diarrhea, rash, or other symptoms.  No treatments attempted at home.    The history is provided by the patient.    Past Medical History:  Diagnosis Date  . Asthma   . Eczema   . Fibroids   . Hypoglycemia     There are no problems to display for this patient.   Past Surgical History:  Procedure Laterality Date  . nailbed surgery     nailbed reconstruction left ring finger  . ROOT CANAL      OB History    Gravida  0   Para  0   Term  0   Preterm  0   AB  0   Living  0     SAB  0   TAB  0   Ectopic  0   Multiple  0   Live Births               Home Medications    Prior to Admission medications   Medication Sig Start Date End Date Taking? Authorizing Provider  guaiFENesin (MUCINEX) 600 MG 12 hr tablet Take 2 tablets (1,200 mg total) by mouth 2 (two) times daily as needed. 12/01/19   Sharion Balloon, NP  ibuprofen (ADVIL) 600 MG tablet Take 1 tablet (600 mg total) by mouth every 6 (six) hours as needed. 12/01/19   Sharion Balloon, NP  medroxyPROGESTERone (DEPO-PROVERA) 150 MG/ML injection Inject 1 mL (150 mg total) into the muscle every 3 (three) months. 06/13/18   Florian Buff, MD  predniSONE (DELTASONE) 10 MG tablet Take 10 mg by mouth daily with breakfast.    [provider]    Family History Family History  Problem Relation Age of Onset  . Hypertension Mother   . Heart disease Other   . Cancer Other   . Diabetes Other   . Stroke Other   . Hypertension Maternal Grandmother   . Diabetes Maternal Grandfather   . Asthma Brother     Social History Social History    Tobacco Use  . Smoking status: Never Smoker  . Smokeless tobacco: Never Used  Substance Use Topics  . Alcohol use: No  . Drug use: No     Allergies   Bee venom, Citrus, and Sulfa antibiotics   Review of Systems Review of Systems  Constitutional: Negative for chills and fever.  HENT: Positive for congestion, ear pain and postnasal drip. Negative for sore throat.   Eyes: Negative for pain and visual disturbance.  Respiratory: Negative for cough and shortness of breath.   Cardiovascular: Negative for chest pain and palpitations.  Gastrointestinal: Negative for abdominal pain, diarrhea, nausea and vomiting.  Genitourinary: Negative for dysuria and hematuria.  Musculoskeletal: Negative for arthralgias and back pain.  Skin: Negative for color change and rash.  Neurological: Negative for seizures and syncope.  All other systems reviewed and are negative.    Physical Exam Triage Vital Signs ED Triage Vitals  Enc Vitals Group     BP      Pulse  Resp      Temp      Temp src      SpO2      Weight      Height      Head Circumference      Peak Flow      Pain Score      Pain Loc      Pain Edu?      Excl. in [place]?    No data found.  Updated Vital Signs BP 105/68 (BP Location: Left Arm)   Pulse 97   Temp 99 F (37.2 C) (Oral)   Resp 16   Ht 5\' 3"  (1.6 m)   Wt 140 lb (63.5 kg)   SpO2 98%   BMI 24.80 kg/m   Visual Acuity Right Eye Distance:   Left Eye Distance:   Bilateral Distance:    Right Eye Near:   Left Eye Near:    Bilateral Near:     Physical Exam Vitals and nursing note reviewed.  Constitutional:      General: She is not in acute distress.    Appearance: She is well-developed.  HENT:     Head: Normocephalic and atraumatic.     Right Ear: Tympanic membrane normal.     Left Ear: Tympanic membrane normal.     Nose: Nose normal.     Comments: Right maxillary tenderness.    Mouth/Throat:     Mouth: Mucous membranes are moist.     Pharynx:  Oropharynx is clear.  Eyes:     Conjunctiva/sclera: Conjunctivae normal.  Cardiovascular:     Rate and Rhythm: Normal rate and regular rhythm.     Heart sounds: No murmur.  Pulmonary:     Effort: Pulmonary effort is normal. No respiratory distress.     Breath sounds: Normal breath sounds.  Abdominal:     Palpations: Abdomen is soft.     Tenderness: There is no abdominal tenderness. There is no guarding or rebound.  Musculoskeletal:     Cervical back: Neck supple.  Skin:    General: Skin is warm and dry.     Findings: No rash.  Neurological:     General: No focal deficit present.     Mental Status: She is alert and oriented to person, place, and time.     Gait: Gait normal.  Psychiatric:        Mood and Affect: Mood normal.        Behavior: Behavior normal.      UC Treatments / Results  Labs (all labs ordered are listed, but only abnormal results are displayed) Labs Reviewed - No data to display  EKG   Radiology No results found.  Procedures Procedures (including critical care time)  Medications Ordered in UC Medications - No data to display  Initial Impression / Assessment and Plan / UC Course  I have reviewed the triage vital signs and the nursing notes.  Pertinent labs & imaging results that were available during my care of the patient were reviewed by me and considered in my medical decision making (see chart for details).   Right eustachian tube dysfunction, URI.  Treating with ibuprofen and Mucinex.  Instructed patient to follow-up with her PCP if her symptoms or not improving.  Patient agrees to plan of care.     Final Clinical Impressions(s) / UC Diagnoses   Final diagnoses:  Eustachian tube dysfunction, right  Upper respiratory tract infection, unspecified type     Discharge Instructions  Take the ibuprofen and Mucinex as directed.    Follow up with your primary care provider if your symptoms are not improving.       ED Prescriptions      Medication Sig Dispense Auth. Provider   ibuprofen (ADVIL) 600 MG tablet Take 1 tablet (600 mg total) by mouth every 6 (six) hours as needed. 30 tablet Sharion Balloon, NP   guaiFENesin (MUCINEX) 600 MG 12 hr tablet Take 2 tablets (1,200 mg total) by mouth 2 (two) times daily as needed. 12 tablet Sharion Balloon, NP     PDMP not reviewed this encounter.   Sharion Balloon, NP 12/01/19 [phone]

## 2019-12-01 NOTE — Discharge Instructions (Signed)
Take the ibuprofen and Mucinex as directed.    Follow up with your primary care provider if your symptoms are not improving.    

## 2020-02-28 ENCOUNTER — Other Ambulatory Visit: Payer: Self-pay

## 2020-02-28 DIAGNOSIS — R109 Unspecified abdominal pain: Secondary | ICD-10-CM | POA: Insufficient documentation

## 2020-02-28 DIAGNOSIS — R110 Nausea: Secondary | ICD-10-CM | POA: Insufficient documentation

## 2020-02-28 DIAGNOSIS — Z5321 Procedure and treatment not carried out due to patient leaving prior to being seen by health care provider: Secondary | ICD-10-CM | POA: Diagnosis not present

## 2020-02-28 DIAGNOSIS — R42 Dizziness and giddiness: Secondary | ICD-10-CM | POA: Insufficient documentation

## 2020-02-28 DIAGNOSIS — R531 Weakness: Secondary | ICD-10-CM | POA: Insufficient documentation

## 2020-02-28 LAB — CBC
HCT: 44.9 % (ref 36.0–46.0)
Hemoglobin: 15 g/dL (ref 12.0–15.0)
MCH: 30.1 pg (ref 26.0–34.0)
MCHC: 33.4 g/dL (ref 30.0–36.0)
MCV: 90.2 fL (ref 80.0–100.0)
Platelets: 263 10*3/uL (ref 150–400)
RBC: 4.98 MIL/uL (ref 3.87–5.11)
RDW: 12.8 % (ref 11.5–15.5)
WBC: 9.3 10*3/uL (ref 4.0–10.5)
nRBC: 0 % (ref 0.0–0.2)

## 2020-02-28 LAB — BASIC METABOLIC PANEL
Anion gap: 8 (ref 5–15)
BUN: 10 mg/dL (ref 6–20)
CO2: 25 mmol/L (ref 22–32)
Calcium: 8.8 mg/dL — ABNORMAL LOW (ref 8.9–10.3)
Chloride: 104 mmol/L (ref 98–111)
Creatinine, Ser: 0.62 mg/dL (ref 0.44–1.00)
GFR calc Af Amer: >60 mL/min (ref >60)
GFR calc non Af Amer: >60 mL/min (ref >60)
Glucose, Bld: 115 mg/dL — ABNORMAL HIGH (ref 70–99)
Potassium: 3.9 mmol/L (ref 3.5–5.1)
Sodium: 137 mmol/L (ref 135–145)

## 2020-02-28 LAB — TROPONIN I (HIGH SENSITIVITY): Troponin I (High Sensitivity): 4 ng/L (ref <18)

## 2020-02-28 NOTE — ED Triage Notes (Signed)
Pt comes POV with covid pos on Monday. Pt states dizziness, weakness, nausea, and stomach cramps.

## 2020-02-29 ENCOUNTER — Emergency Department
Admission: EM | Admit: 2020-02-29 | Discharge: 2020-02-29 | Disposition: A | Discharge status: Home / Self Care | Payer: BC Managed Care – PPO | Attending: Emergency Medicine | Admitting: Emergency Medicine

## 2020-02-29 NOTE — ED Notes (Signed)
No answer when called several times from lobby 

## 2020-07-30 ENCOUNTER — Emergency Department
Admission: EM | Admit: 2020-07-30 | Discharge: 2020-07-30 | Disposition: A | Discharge status: Home / Self Care | Payer: BC Managed Care – PPO

## 2020-07-30 NOTE — ED Notes (Signed)
Pt called no answer 

## 2020-07-30 NOTE — ED Notes (Signed)
Pt called 3x no answer  

## 2020-07-30 NOTE — ED Notes (Signed)
Pt called again with no answer 

## 2020-09-03 ENCOUNTER — Telehealth: Payer: Self-pay | Admitting: Obstetrics & Gynecology

## 2020-09-03 NOTE — Telephone Encounter (Signed)
Patient wants to know if she can get a refill on Birth Control (not the Depo), she wants to change over to the birth control pill. Per patient.. Clinical staff will follow up with patient.

## 2020-09-03 NOTE — Telephone Encounter (Addendum)
Returned pt's call. Pt explained that she had moved and couldn't be seen at her new GYN until March. She is requesting a one month supply of the oral contraceptive she was on before Depo. After looking through her chart, she confirmed that it was Lo loestrin. She could not tell me the last time she had her Depo, but knew that it was well over 13 weeks ago. I told her I would research and see what she needed and call her back.  After reading through her chart, her last Depo injection was 05/15/19, last pap 05/28/17, LMP 08/12/20, and last sexual encounter was 07/19/20.  Called pt to inform her that she would have to be seen in our office again before being prescribed any medications due to the length of time it has been since her last [facility] and pap smear. Pt was irritated and swore that she had a pap smear in 2020, but there is no record of that in her chart.

## 2020-11-18 IMAGING — DX DG CHEST 2V
2 series · 2 of 2 positions shown · non-contrast
Comparison: CT scan of December 16, 2011.

CLINICAL DATA: Cough.

EXAM:
CHEST - 2 VIEW

[chest pa]
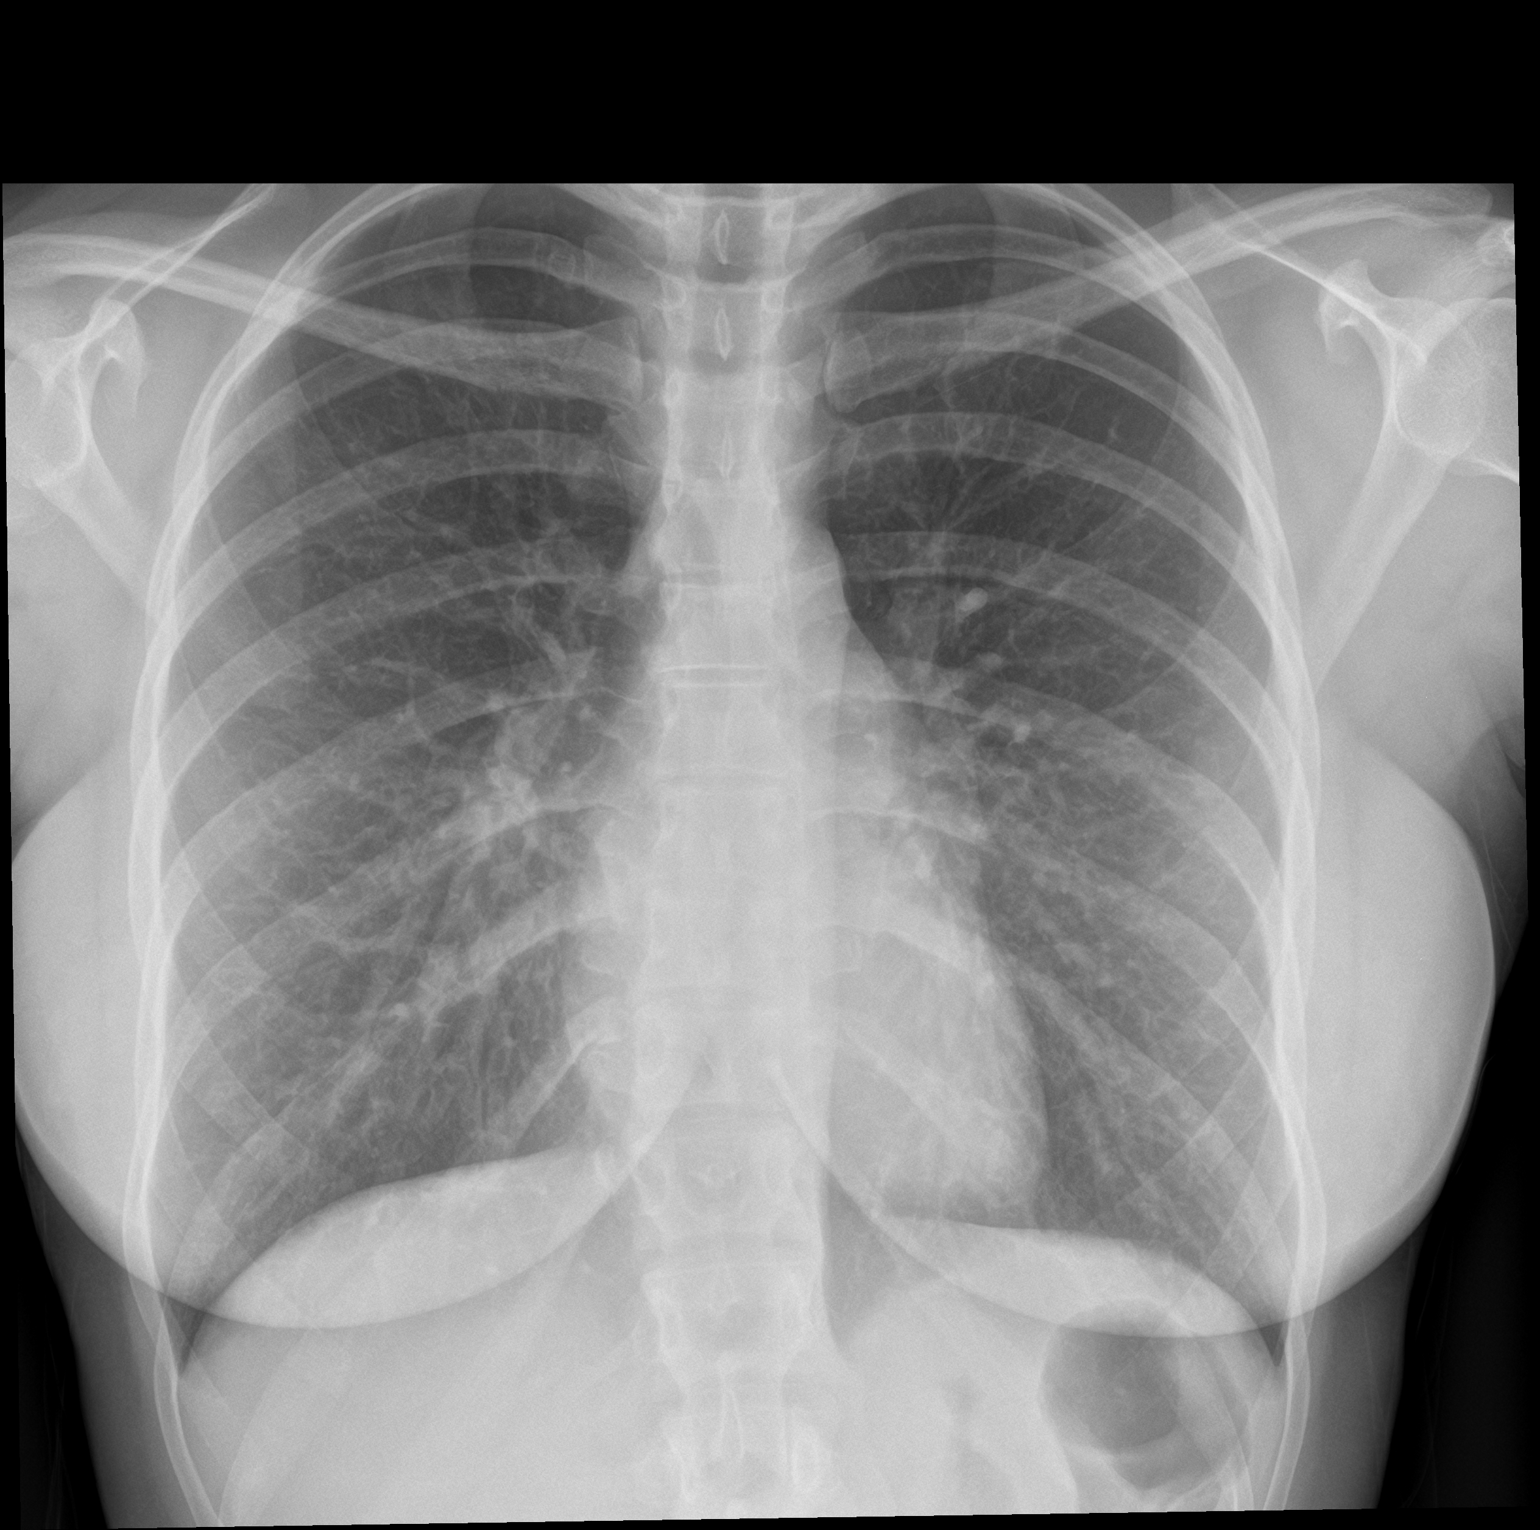

[chest lat]
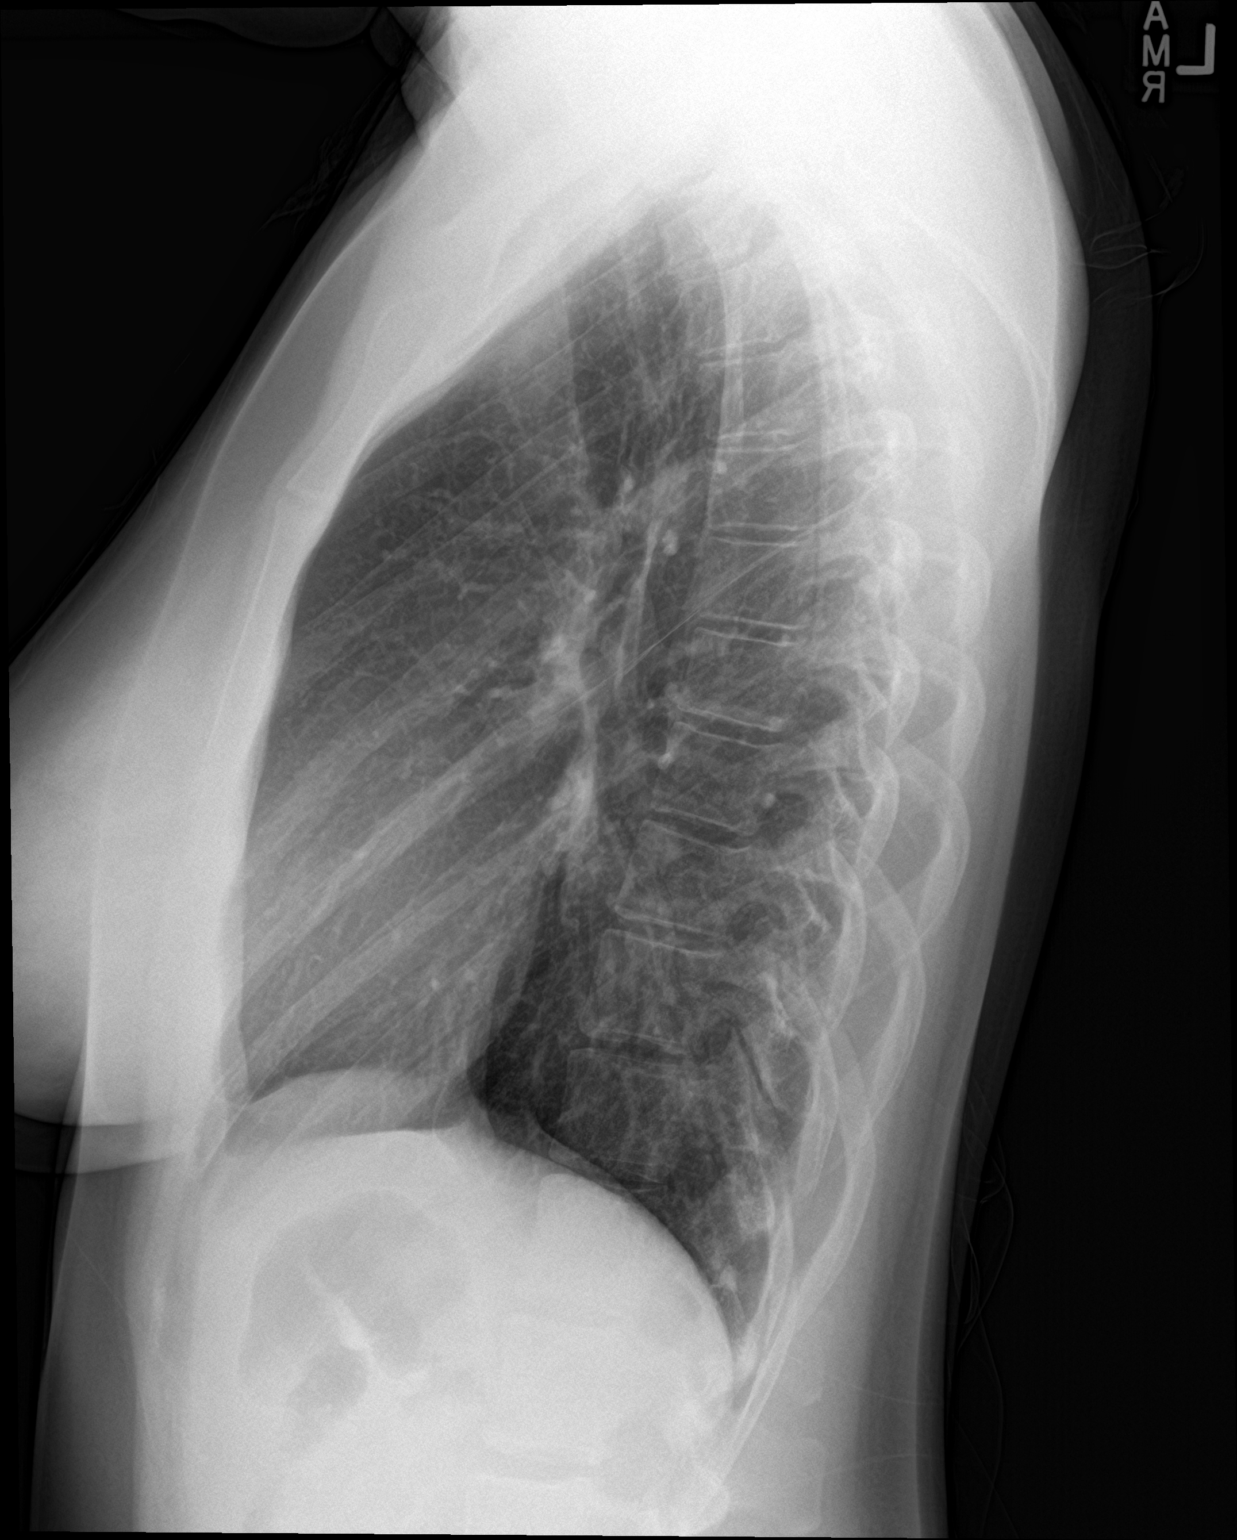

[2 of 2 positions shown; findings below may reference images not displayed]

FINDINGS: The heart size and mediastinal contours are within normal limits.
Both lungs are clear. The visualized skeletal structures are
unremarkable.
IMPRESSION: No active cardiopulmonary disease.

## 2020-12-16 ENCOUNTER — Other Ambulatory Visit: Payer: Self-pay

## 2020-12-16 ENCOUNTER — Emergency Department
Admission: EM | Admit: 2020-12-16 | Discharge: 2020-12-16 | Disposition: A | Discharge status: Home / Self Care | Payer: BC Managed Care – PPO | Attending: Emergency Medicine | Admitting: Emergency Medicine

## 2020-12-16 DIAGNOSIS — J029 Acute pharyngitis, unspecified: Secondary | ICD-10-CM | POA: Diagnosis present

## 2020-12-16 DIAGNOSIS — J45909 Unspecified asthma, uncomplicated: Secondary | ICD-10-CM | POA: Diagnosis not present

## 2020-12-16 DIAGNOSIS — Z28310 Unvaccinated for covid-19: Secondary | ICD-10-CM | POA: Insufficient documentation

## 2020-12-16 LAB — GROUP A STREP BY PCR: Group A Strep by PCR: NOT DETECTED

## 2020-12-16 MED ORDER — LIDOCAINE VISCOUS HCL 2 % MT SOLN
15.0000 mL | Freq: Once | OROMUCOSAL | Status: AC
  Administered 2020-12-16: 15 mL via OROMUCOSAL
  Filled 2020-12-16: qty 15

## 2020-12-16 MED ORDER — LIDOCAINE VISCOUS HCL 2 % MT SOLN: 5.0000 mL | Freq: Four times a day (QID) | OROMUCOSAL | 0 refills | Status: DC | PRN

## 2020-12-16 MED ORDER — DIPHENHYDRAMINE HCL 12.5 MG/5ML PO ELIX
12.5000 mg | ORAL_SOLUTION | Freq: Once | ORAL | Status: AC
  Administered 2020-12-16: 12.5 mg via ORAL
  Filled 2020-12-16: qty 5

## 2020-12-16 MED ORDER — PSEUDOEPH-BROMPHEN-DM 30-2-10 MG/5ML PO SYRP: 5.0000 mL | ORAL_SOLUTION | Freq: Four times a day (QID) | ORAL | 0 refills | Status: DC | PRN

## 2020-12-16 NOTE — ED Notes (Signed)
Says swollen tonsils for about 2 weeks.  No fever.  Throat red.  She says it hurts to swollow, but she is handling secretions without problem.  Alert.

## 2020-12-16 NOTE — ED Notes (Signed)
See triage note  Presents with sore throat for 2 weeks  States she was seen at Urgent Care but has not had a strept test done  Increased pain with swallowing

## 2020-12-16 NOTE — ED Triage Notes (Signed)
Pt comes with c/o sore throat for about two weeks. Pt states pain when swallowing.

## 2020-12-16 NOTE — ED Provider Notes (Signed)
[facility] Emergency Department Provider Note   ____________________________________________   Event Date/Time   First MD Initiated Contact with Patient 12/16/20 1425     (approximate)  I have reviewed the triage vital signs and the nursing notes.   HISTORY  Chief Complaint Sore Throat    HPI Heidi Foley is a 25 y.o. female patient presents with 2 weeks of sore throat.  Patient states she can tolerate fluids without difficulty with solid foods.  Patient denies recent travel or known contact with COVID-19.  Patient is not taking the COVID-vaccine or flu shot for the season.         Past Medical History:  Diagnosis Date  . Asthma   . Eczema   . Fibroids   . Hypoglycemia     There are no problems to display for this patient.   Past Surgical History:  Procedure Laterality Date  . nailbed surgery     nailbed reconstruction left ring finger  . ROOT CANAL      Prior to Admission medications   Medication Sig Start Date End Date Taking? Authorizing Provider  brompheniramine-pseudoephedrine-DM 30-2-10 MG/5ML syrup Take 5 mLs by mouth 4 (four) times daily as needed. Mix with 5 mL of viscous lidocaine for swish and swallow. 12/16/20  Yes Sable Feil, PA-C  lidocaine (XYLOCAINE) 2 % solution Use as directed 5 mLs in the mouth or throat every 6 (six) hours as needed for mouth pain. Mix with Bromfed-DM for swish and swallow. 12/16/20  Yes Sable Feil, PA-C  medroxyPROGESTERone (DEPO-PROVERA) 150 MG/ML injection Inject 1 mL (150 mg total) into the muscle every 3 (three) months. 06/13/18   Florian Buff, MD  predniSONE (DELTASONE) 10 MG tablet Take 10 mg by mouth daily with breakfast.    [provider]    Allergies Bee venom, Citrus, and Sulfa antibiotics  Family History  Problem Relation Age of Onset  . Hypertension Mother   . Heart disease Other   . Cancer Other   . Diabetes Other   . Stroke Other   . Hypertension Maternal  Grandmother   . Diabetes Maternal Grandfather   . Asthma Brother     Social History Social History   Tobacco Use  . Smoking status: Never Smoker  . Smokeless tobacco: Never Used  Vaping Use  . Vaping Use: Never used  Substance Use Topics  . Alcohol use: No  . Drug use: No    Review of Systems Constitutional: No fever/chills Eyes: No visual changes. ENT: Sore throat and nasal congestion.  Postnasal drainage.   Cardiovascular: Denies chest pain. Respiratory: Denies shortness of breath. Gastrointestinal: No abdominal pain.  No nausea, no vomiting.  No diarrhea.  No constipation. Genitourinary: Negative for dysuria. Musculoskeletal: Negative for back pain. Skin: Negative for rash. Neurological: Negative for headaches, focal weakness or numbness. Allergic/Immunilogical: Bee venom, sepsis, and sulfa antibiotics. ____________________________________________   PHYSICAL EXAM:  VITAL SIGNS: ED Triage Vitals  Enc Vitals Group     BP 12/16/20 1208 109/84     Pulse Rate 12/16/20 1208 99     Resp 12/16/20 1208 19     Temp 12/16/20 1208 98.4 F (36.9 C)     Temp Source 12/16/20 1208 Oral     SpO2 12/16/20 1208 100 %     Weight --      Height --      Head Circumference --      Peak Flow --  Pain Score 12/16/20 1210 10     Pain Loc --      Pain Edu? --      Excl. in [place]? --     Constitutional: Alert and oriented. Well appearing and in no acute distress. Nose: No congestion/rhinnorhea. Mouth/Throat: Mucous membranes are moist.  Oropharynx non-erythematous. Neck: No stridor.  Hematological/Lymphatic/Immunilogical: No cervical lymphadenopathy. Cardiovascular: Normal rate, regular rhythm. Grossly normal heart sounds.  Good peripheral circulation. Respiratory: Normal respiratory effort.  No retractions. Lungs CTAB. Genitourinary: Deferred Neurologic:  Normal speech and language. No gross focal neurologic deficits are appreciated. No gait instability. Skin:  Skin is warm,  dry and intact. No rash noted. Psychiatric: Mood and affect are normal. Speech and behavior are normal.  ____________________________________________   LABS (all labs ordered are listed, but only abnormal results are displayed)  Labs Reviewed  GROUP A STREP BY PCR   ____________________________________________  EKG   ____________________________________________  RADIOLOGY I, Sable Feil, personally viewed and evaluated these images (plain radiographs) as part of my medical decision making, as well as reviewing the written report by the radiologist.  ED MD interpretation:    Official radiology report(s): No results found.  ____________________________________________   PROCEDURES  Procedure(s) performed (including Critical Care):  Procedures   ____________________________________________   INITIAL IMPRESSION / ASSESSMENT AND PLAN / ED COURSE  As part of my medical decision making, I reviewed the following data within the [place]         Patient presents with approximate 2 weeks of sore throat.  Discussed negative rapid strep test results with patient.  Patient complaining physical exam consistent with viral pharyngitis.  Patient given discharge care instruction advised take medication as directed.  Follow-up with PCP.     ____________________________________________   FINAL CLINICAL IMPRESSION(S) / ED DIAGNOSES  Final diagnoses:  Viral pharyngitis     ED Discharge Orders         Ordered    lidocaine (XYLOCAINE) 2 % solution  Every 6 hours PRN        12/16/20 1527    brompheniramine-pseudoephedrine-DM 30-2-10 MG/5ML syrup  4 times daily PRN        12/16/20 1527          *Please note:  Heidi Foley was evaluated in Emergency Department on 12/16/2020 for the symptoms described in the history of present illness. She was evaluated in the context of the global COVID-19 pandemic, which necessitated consideration that the patient  might be at risk for infection with the SARS-CoV-2 virus that causes COVID-19. Institutional protocols and algorithms that pertain to the evaluation of patients at risk for COVID-19 are in a state of rapid change based on information released by regulatory bodies including the CDC and federal and state organizations. These policies and algorithms were followed during the patient's care in the ED.  Some ED evaluations and interventions may be delayed as a result of limited staffing during and the pandemic.*   Note:  This document was prepared using Dragon voice recognition software and may include unintentional dictation errors.    Sable Feil, PA-C 12/16/20 1532    Delman Kitten, MD 12/16/20 [phone]

## 2020-12-16 NOTE — Discharge Instructions (Signed)
Your rapid strep test was negative.  Follow discharge care instruction take medication as directed.

## 2022-08-20 ENCOUNTER — Ambulatory Visit: Payer: Self-pay

## 2022-08-22 ENCOUNTER — Emergency Department (HOSPITAL_COMMUNITY)
Admission: EM | Admit: 2022-08-22 | Discharge: 2022-08-22 | Disposition: A | Discharge status: Home / Self Care | Payer: BC Managed Care – PPO | Attending: Emergency Medicine | Admitting: Emergency Medicine

## 2022-08-22 ENCOUNTER — Other Ambulatory Visit: Payer: Self-pay

## 2022-08-22 DIAGNOSIS — X153XXA Contact with hot saucepan or skillet, initial encounter: Secondary | ICD-10-CM | POA: Insufficient documentation

## 2022-08-22 DIAGNOSIS — T310 Burns involving less than 10% of body surface: Secondary | ICD-10-CM | POA: Diagnosis not present

## 2022-08-22 DIAGNOSIS — Y93G3 Activity, cooking and baking: Secondary | ICD-10-CM | POA: Diagnosis not present

## 2022-08-22 DIAGNOSIS — T23251A Burn of second degree of right palm, initial encounter: Secondary | ICD-10-CM | POA: Insufficient documentation

## 2022-08-22 DIAGNOSIS — Z23 Encounter for immunization: Secondary | ICD-10-CM | POA: Diagnosis not present

## 2022-08-22 MED ORDER — NAPROXEN 250 MG PO TABS
500.0000 mg | ORAL_TABLET | Freq: Once | ORAL | Status: AC
  Administered 2022-08-22: 500 mg via ORAL
  Filled 2022-08-22: qty 2

## 2022-08-22 MED ORDER — TETANUS-DIPHTH-ACELL PERTUSSIS 5-2.5-18.5 LF-MCG/0.5 IM SUSY
0.5000 mL | PREFILLED_SYRINGE | Freq: Once | INTRAMUSCULAR | Status: AC
  Administered 2022-08-22: 0.5 mL via INTRAMUSCULAR
  Filled 2022-08-22: qty 0.5

## 2022-08-22 NOTE — ED Triage Notes (Signed)
Patient accidentally held a hot pan this evening while cooking , presents with redness/blister at right palm .

## 2022-08-22 NOTE — ED Provider Notes (Signed)
[facility] Provider Note   CSN: [id number] Arrival date & time: 08/22/22  0005     History  Chief Complaint  Patient presents with   Hand Burn    Heidi Foley is a 27 y.o. female.  27 year old female presents to the emergency department for evaluation of a burn to her right hand.  She states that she was trying to do her hair while also taking a pan of pizza rolls out of the oven when she accidentally touched the hot pan with her right hand.  She reports a stinging sensation at the site of her burn which has been constant and unchanged.  Symptoms associated with redness.  She has applied a cool compress with minimal relief.  Last tetanus unknown.  The history is provided by the patient. No language interpreter was used.       Home Medications Prior to Admission medications   Medication Sig Start Date End Date Taking? Authorizing Provider  brompheniramine-pseudoephedrine-DM 30-2-10 MG/5ML syrup Take 5 mLs by mouth 4 (four) times daily as needed. Mix with 5 mL of viscous lidocaine for swish and swallow. 12/16/20   Sable Feil, PA-C  lidocaine (XYLOCAINE) 2 % solution Use as directed 5 mLs in the mouth or throat every 6 (six) hours as needed for mouth pain. Mix with Bromfed-DM for swish and swallow. 12/16/20   Sable Feil, PA-C  medroxyPROGESTERone (DEPO-PROVERA) 150 MG/ML injection Inject 1 mL (150 mg total) into the muscle every 3 (three) months. 06/13/18   Florian Buff, MD  predniSONE (DELTASONE) 10 MG tablet Take 10 mg by mouth daily with breakfast.    [provider]      Allergies    Bee venom, Citrus, and Sulfa antibiotics    Review of Systems   Review of Systems Ten systems reviewed and are negative for acute change, except as noted in the HPI.    Physical Exam Updated Vital Signs BP 109/75   Pulse 82   Temp 98.3 F (36.8 C) (Oral)   Resp 18   SpO2 99%   Physical Exam Vitals and nursing note  reviewed.  Constitutional:      General: She is not in acute distress.    Appearance: She is well-developed. She is not diaphoretic.     Comments: Nontoxic appearing and in NAD  HENT:     Head: Normocephalic and atraumatic.  Eyes:     General: No scleral icterus.    Conjunctiva/sclera: Conjunctivae normal.  Cardiovascular:     Rate and Rhythm: Normal rate and regular rhythm.     Pulses: Normal pulses.     Comments: Distal radial pulse 2+ in the RUE Pulmonary:     Effort: Pulmonary effort is normal. No respiratory distress.  Musculoskeletal:        General: Normal range of motion.     Cervical back: Normal range of motion.  Skin:    General: Skin is warm and dry.     Coloration: Skin is not pale.     Findings: Erythema present. No rash.     Comments: Circular area of erythema with central pallor and blistering notes to the palmar aspect of the R hand. Area is TTP without drainage, purulence, induration.  Neurological:     Mental Status: She is alert and oriented to person, place, and time.  Psychiatric:        Behavior: Behavior normal.      ED Results /  Procedures / Treatments   Labs (all labs ordered are listed, but only abnormal results are displayed) Labs Reviewed - No data to display  EKG None  Radiology No results found.  Procedures .Burn Treatment  Date/Time: 08/22/2022 12:56 AM  Performed by: Antonietta Breach, PA-C Authorized by: Antonietta Breach, PA-C   Consent:    Consent obtained:  Verbal   Consent given by:  Patient   Risks, benefits, and alternatives were discussed: yes     Risks discussed:  Pain   Alternatives discussed:  No treatment Universal protocol:    Relevant documents present and verified: yes     Test results available: yes     Imaging studies available: yes     Patient identity confirmed:  Verbally with patient and arm band Procedure details:    Total body partial/full burn extent (%): <1%   Escharotomy performed: no   Burn area 1 details:     Burn depth:  Partial thickness (2nd)   Affected area:  Upper extremity   Upper extremity location:  R hand   Debridement performed: no     Wound treatment:  Bacitracin   Dressing:  Petrolatum gauze Post-procedure details:    Procedure completion:  Tolerated well, no immediate complications     Medications Ordered in ED Medications  naproxen (NAPROSYN) tablet 500 mg (500 mg Oral Given 08/22/22 0025)  Tdap (BOOSTRIX) injection 0.5 mL (0.5 mLs Intramuscular Given 08/22/22 0029)    ED Course/ Medical Decision Making/ A&P                             Medical Decision Making Risk Prescription drug management.   This patient presents to the ED for concern of burn to R hand, this involves an extensive number of treatment options, and is a complaint that carries with it a high risk of complications and morbidity.  The differential diagnosis includes first degree burn vs 2nd degree burn vs 3rd degree burn vs cellulitis.   Co morbidities that complicate the patient evaluation  None    Cardiac Monitoring:  The patient was maintained on a cardiac monitor.  I personally viewed and interpreted the cardiac monitored which showed an underlying rhythm of: NSR   Medicines ordered and prescription drug management:  I ordered medication including Tdap for tetanus prophylaxis and Naproxen for pain  Reevaluation of the patient after these medicines showed that the patient stayed the same I have reviewed the patients home medicines and have made adjustments as needed   Problem List / ED Course:  27 year old female neurovascularly intact with evidence of second-degree burn to palmar aspect of right hand.  See image above.  No complicating features such as cellulitis, abscess.  No evidence of compartment syndrome. Tetanus updated in the ED and wound dressed with bacitracin, petroleum gauze, Ace wrap.   Reevaluation:  After the interventions noted above, I reevaluated the patient and  found that they have :stayed the same   Social Determinants of Health:  Insured patient   Dispostion:  After consideration of the diagnostic results and the patients response to treatment, I feel that the patent would benefit from outpatient wound care and OTC analgesics PRN for pain. Return precautions discussed and provided. Patient discharged in stable condition with no unaddressed concerns.          Final Clinical Impression(s) / ED Diagnoses Final diagnoses:  Partial thickness burn of palm of right hand, initial encounter  Rx / DC Orders ED Discharge Orders     None         Antonietta Breach, PA-C 08/22/22 0100    Mesner, Corene Cornea, MD 08/22/22 [phone]

## 2022-08-22 NOTE — Discharge Instructions (Addendum)
Take Tylenol or ibuprofen for pain.  We recommend application of bacitracin to your burn site at least twice per day.  Keep the area clean and dry.  Have wound rechecked by primary care doctor in 48 to 72 hours.  You may return for new or concerning symptoms.

## 2022-09-25 ENCOUNTER — Ambulatory Visit
Admission: EM | Admit: 2022-09-25 | Discharge: 2022-09-25 | Disposition: A | Discharge status: Home / Self Care | Payer: BC Managed Care – PPO | Attending: Internal Medicine | Admitting: Internal Medicine

## 2022-09-25 DIAGNOSIS — R350 Frequency of micturition: Secondary | ICD-10-CM | POA: Diagnosis present

## 2022-09-25 LAB — POCT URINALYSIS DIP (MANUAL ENTRY)
Bilirubin, UA: NEGATIVE
Glucose, UA: NEGATIVE mg/dL
Ketones, POC UA: NEGATIVE mg/dL
Leukocytes, UA: NEGATIVE
Nitrite, UA: POSITIVE — AB
Protein Ur, POC: NEGATIVE mg/dL
Spec Grav, UA: >=1.03 — AB (ref 1.010–1.025)
Urobilinogen, UA: 0.2 E.U./dL
pH, UA: 6 (ref 5.0–8.0)

## 2022-09-25 LAB — POCT URINE PREGNANCY: Preg Test, Ur: NEGATIVE

## 2022-09-25 MED ORDER — NITROFURANTOIN MONOHYD MACRO 100 MG PO CAPS: 100.0000 mg | ORAL_CAPSULE | Freq: Two times a day (BID) | ORAL | 0 refills | Status: DC

## 2022-09-25 NOTE — Discharge Instructions (Addendum)
Your nuswab is pending. Your results will show in your MyChart. Any positive results will result in a phone call from a nurse with next steps in treatment and recommendations.   Your urinalysis is positive for nitrites. Urine culture pending this confirms the type of bacteria and if the treatment is effective. Due to your symptoms and history you have been started on a treatment of  Macrobid twice a day for 5 days.  Encourage completion of your treatment even when symptoms improve.  Discussed resistance with antibiotic if over used.   Discussed allergic reactions with antibiotics Encourage increasing hydration with water  Add cranberry juice 100% 8 -16 ozs daily until symptoms improve Discussed hygiene and voiding when the urge presents

## 2022-09-25 NOTE — ED Triage Notes (Signed)
Patient with c/o urinary frequency and intermittent nausea for a few days. Patient states she isn't sure if she has a UTI or a yeast infection.

## 2022-09-25 NOTE — ED Provider Notes (Signed)
[facility]    CSN: [id number] Arrival date & time: 09/25/22  1516      History   Chief Complaint Chief Complaint  Patient presents with   Urinary Frequency    HPI Heidi Foley is a 27 y.o. female.   HPI She is in today for a urinary frequency for about one week. She is having vaginal discharge and dysuria. Her last MC was 2/3. She has been sexually active. She endorses a hx of UTI and BV. Denies any pelvic pain or tenderness, amenorrhea irregular bleeding or prolonged heavy bleeding.  Denies ulcers or lesions  Past Medical History:  Diagnosis Date   Asthma    Eczema    Fibroids    Hypoglycemia     There are no problems to display for this patient.   Past Surgical History:  Procedure Laterality Date   nailbed surgery     nailbed reconstruction left ring finger   ROOT CANAL      OB History     Gravida  0   Para  0   Term  0   Preterm  0   AB  0   Living  0      SAB  0   IAB  0   Ectopic  0   Multiple  0   Live Births               Home Medications    Prior to Admission medications   Medication Sig Start Date End Date Taking? Authorizing Provider  nitrofurantoin, macrocrystal-monohydrate, (MACROBID) 100 MG capsule Take 1 capsule (100 mg total) by mouth 2 (two) times daily. 09/25/22  Yes Vevelyn Francois, NP    Family History Family History  Problem Relation Age of Onset   Hypertension Mother    Heart disease Other    Cancer Other    Diabetes Other    Stroke Other    Hypertension Maternal Grandmother    Diabetes Maternal Grandfather    Asthma Brother     Social History Social History   Tobacco Use   Smoking status: Never   Smokeless tobacco: Never  Vaping Use   Vaping Use: Never used  Substance Use Topics   Alcohol use: No   Drug use: No     Allergies   Bee venom, Citrus, and Sulfa antibiotics   Review of Systems Review of Systems   Physical Exam Triage Vital Signs ED Triage Vitals  Enc Vitals  Group     BP 09/25/22 1559 107/74     Pulse Rate 09/25/22 1559 69     Resp 09/25/22 1559 20     Temp 09/25/22 1559 98.5 F (36.9 C)     Temp Source 09/25/22 1559 Oral     SpO2 09/25/22 1559 96 %     Weight --      Height --      Head Circumference --      Peak Flow --      Pain Score 09/25/22 1600 1     Pain Loc --      Pain Edu? --      Excl. in [place]? --    No data found.  Updated Vital Signs BP 107/74 (BP Location: Left Arm)   Pulse 69   Temp 98.5 F (36.9 C) (Oral)   Resp 20   LMP 09/05/2022 (Exact Date)   SpO2 96%   Visual Acuity Right Eye Distance:   Left Eye Distance:  Bilateral Distance:    Right Eye Near:   Left Eye Near:    Bilateral Near:     Physical Exam HENT:     Head: Normocephalic.     Nose: Nose normal.     Mouth/Throat:     Mouth: Mucous membranes are moist.  Cardiovascular:     Rate and Rhythm: Normal rate.     Pulses: Normal pulses.  Pulmonary:     Effort: Pulmonary effort is normal.  Musculoskeletal:        General: Normal range of motion.     Cervical back: Normal range of motion.  Skin:    General: Skin is warm and dry.     Capillary Refill: Capillary refill takes less than 2 seconds.  Neurological:     General: No focal deficit present.     Mental Status: She is alert.  Psychiatric:        Mood and Affect: Mood normal.        Behavior: Behavior normal.      UC Treatments / Results  Labs (all labs ordered are listed, but only abnormal results are displayed) Labs Reviewed  POCT URINALYSIS DIP (MANUAL ENTRY) - Abnormal; Notable for the following components:      Result Value   Clarity, UA hazy (*)    Spec Grav, UA >=1.030 (*)    Blood, UA trace-intact (*)    Nitrite, UA Positive (*)    All other components within normal limits  URINE CULTURE  POCT URINE PREGNANCY  CERVICOVAGINAL ANCILLARY ONLY    EKG   Radiology No results found.  Procedures Procedures (including critical care time)  Medications Ordered in  UC Medications - No data to display  Initial Impression / Assessment and Plan / UC Course  I have reviewed the triage vital signs and the nursing notes.  Pertinent labs & imaging results that were available during my care of the patient were reviewed by me and considered in my medical decision making (see chart for details).     Urinary frequency  Final Clinical Impressions(s) / UC Diagnoses   Final diagnoses:  Urinary frequency     Discharge Instructions      Your nuswab is pending. Your results will show in your MyChart. Any positive results will result in a phone call from a nurse with next steps in treatment and recommendations.   Your urinalysis is positive for nitrites. Urine culture pending this confirms the type of bacteria and if the treatment is effective. Due to your symptoms and history you have been started on a treatment of  Macrobid twice a day for 5 days.  Encourage completion of your treatment even when symptoms improve.  Discussed resistance with antibiotic if over used.   Discussed allergic reactions with antibiotics Encourage increasing hydration with water  Add cranberry juice 100% 8 -16 ozs daily until symptoms improve Discussed hygiene and voiding when the urge presents      ED Prescriptions     Medication Sig Dispense Auth. Provider   nitrofurantoin, macrocrystal-monohydrate, (MACROBID) 100 MG capsule Take 1 capsule (100 mg total) by mouth 2 (two) times daily. 10 capsule Vevelyn Francois, NP      PDMP not reviewed this encounter.   Dionisio David [place], [place] 09/25/22 [phone]

## 2022-09-28 LAB — CERVICOVAGINAL ANCILLARY ONLY
Bacterial Vaginitis (gardnerella): POSITIVE — AB
Candida Glabrata: NEGATIVE
Candida Vaginitis: POSITIVE — AB
Chlamydia: NEGATIVE
Comment: NEGATIVE
Comment: NEGATIVE
Comment: NEGATIVE
Comment: NEGATIVE
Comment: NEGATIVE
Comment: NORMAL
Neisseria Gonorrhea: NEGATIVE
Trichomonas: NEGATIVE

## 2022-09-28 LAB — URINE CULTURE: Culture: >=100000 — AB

## 2022-09-29 ENCOUNTER — Telehealth (HOSPITAL_COMMUNITY): Payer: Self-pay | Admitting: Emergency Medicine

## 2022-09-29 MED ORDER — FLUCONAZOLE 150 MG PO TABS: 150.0000 mg | ORAL_TABLET | Freq: Once | ORAL | 0 refills | Status: AC

## 2022-09-29 MED ORDER — METRONIDAZOLE 500 MG PO TABS: 500.0000 mg | ORAL_TABLET | Freq: Two times a day (BID) | ORAL | 0 refills | Status: DC

## 2022-12-31 ENCOUNTER — Ambulatory Visit
Admission: RE | Admit: 2022-12-31 | Discharge: 2022-12-31 | Disposition: A | Discharge status: Home / Self Care | Payer: BC Managed Care – PPO | Source: Ambulatory Visit | Attending: Internal Medicine | Admitting: Internal Medicine

## 2022-12-31 VITALS — BP 105/70 | HR 100 | Temp 98.4°F | Resp 18

## 2022-12-31 DIAGNOSIS — N898 Other specified noninflammatory disorders of vagina: Secondary | ICD-10-CM | POA: Insufficient documentation

## 2022-12-31 DIAGNOSIS — Z113 Encounter for screening for infections with a predominantly sexual mode of transmission: Secondary | ICD-10-CM | POA: Insufficient documentation

## 2022-12-31 MED ORDER — FLUCONAZOLE 150 MG PO TABS: 150.0000 mg | ORAL_TABLET | Freq: Every day | ORAL | 0 refills | Status: DC

## 2022-12-31 NOTE — ED Provider Notes (Signed)
[facility]    CSN: [id number] Arrival date & time: 12/31/22  1644      History   Chief Complaint Chief Complaint  Patient presents with   Vaginal Itching    Vaginal irritation and discharge. Oral thrush . - Entered by patient    HPI Heidi Foley is a 27 y.o. female.   Patient presents today with vaginal irritation and itching that started about 2 days ago.  Patient is concerned for yeast infection given that she has had the symptoms previously.  Denies dysuria, urinary frequency, abdominal pain, back pain, fever.  Denies exposure to STD but has had a new sexual partner.  Last menstrual cycle was 12/12/2022.  She took a leftover Diflucan yesterday with some improvement.   Vaginal Itching    Past Medical History:  Diagnosis Date   Asthma    Eczema    Fibroids    Hypoglycemia     There are no problems to display for this patient.   Past Surgical History:  Procedure Laterality Date   nailbed surgery     nailbed reconstruction left ring finger   ROOT CANAL      OB History     Gravida  0   Para  0   Term  0   Preterm  0   AB  0   Living  0      SAB  0   IAB  0   Ectopic  0   Multiple  0   Live Births               Home Medications    Prior to Admission medications   Medication Sig Start Date End Date Taking? Authorizing Provider  fluconazole (DIFLUCAN) 150 MG tablet Take 1 tablet (150 mg total) by mouth daily. 12/31/22  Yes Lazariah Savard, Rolly Salter E, FNP  metroNIDAZOLE (FLAGYL) 500 MG tablet Take 1 tablet (500 mg total) by mouth 2 (two) times daily. 09/29/22   LampteyBritta Mccreedy, MD  nitrofurantoin, macrocrystal-monohydrate, (MACROBID) 100 MG capsule Take 1 capsule (100 mg total) by mouth 2 (two) times daily. 09/25/22   Barbette Merino, NP    Family History Family History  Problem Relation Age of Onset   Hypertension Mother    Heart disease Other    Cancer Other    Diabetes Other    Stroke Other    Hypertension Maternal Grandmother     Diabetes Maternal Grandfather    Asthma Brother     Social History Social History   Tobacco Use   Smoking status: Never   Smokeless tobacco: Never  Vaping Use   Vaping Use: Never used  Substance Use Topics   Alcohol use: No   Drug use: No     Allergies   Bee venom, Citrus, and Sulfa antibiotics   Review of Systems Review of Systems Per HPI  Physical Exam Triage Vital Signs ED Triage Vitals  Enc Vitals Group     BP 12/31/22 1707 105/70     Pulse Rate 12/31/22 1707 100     Resp 12/31/22 1707 18     Temp 12/31/22 1707 98.4 F (36.9 C)     Temp Source 12/31/22 1707 Oral     SpO2 12/31/22 1707 97 %     Weight --      Height --      Head Circumference --      Peak Flow --      Pain Score 12/31/22 1705 0  Pain Loc --      Pain Edu? --      Excl. in GC? --    No data found.  Updated Vital Signs BP 105/70 (BP Location: Left Arm)   Pulse 100   Temp 98.4 F (36.9 C) (Oral)   Resp 18   LMP 12/12/2022   SpO2 97%   Visual Acuity Right Eye Distance:   Left Eye Distance:   Bilateral Distance:    Right Eye Near:   Left Eye Near:    Bilateral Near:     Physical Exam Constitutional:      General: She is not in acute distress.    Appearance: Normal appearance. She is not toxic-appearing or diaphoretic.  HENT:     Head: Normocephalic and atraumatic.  Eyes:     Extraocular Movements: Extraocular movements intact.     Conjunctiva/sclera: Conjunctivae normal.  Pulmonary:     Effort: Pulmonary effort is normal.  Genitourinary:    Comments: Deferred with shared decision making. Self swab performed.  Neurological:     General: No focal deficit present.     Mental Status: She is alert and oriented to person, place, and time. Mental status is at baseline.  Psychiatric:        Mood and Affect: Mood normal.        Behavior: Behavior normal.        Thought Content: Thought content normal.        Judgment: Judgment normal.      UC Treatments / Results   Labs (all labs ordered are listed, but only abnormal results are displayed) Labs Reviewed  CERVICOVAGINAL ANCILLARY ONLY    EKG   Radiology No results found.  Procedures Procedures (including critical care time)  Medications Ordered in UC Medications - No data to display  Initial Impression / Assessment and Plan / UC Course  I have reviewed the triage vital signs and the nursing notes.  Pertinent labs & imaging results that were available during my care of the patient were reviewed by me and considered in my medical decision making (see chart for details).     Patient has had yeast infection previously and states this feels similar, therefore will send another Diflucan pill for patient to take 3 days from previous pill.  Cervicovaginal swab pending to confirm.  Awaiting results for any change in treatment.  Patient denied urinary symptoms so UA was deferred.  Advised strict follow-up precautions.  Patient verbalized understanding and was agreeable with plan. Final Clinical Impressions(s) / UC Diagnoses   Final diagnoses:  Vaginal irritation  Screening examination for venereal disease     Discharge Instructions      Your vaginal swab is pending.  Will call with results.  I have prescribed you Diflucan but recommend not taking until 3 days after the other pill that you took.  Follow-up if any symptoms persist or worsen.    ED Prescriptions     Medication Sig Dispense Auth. Provider   fluconazole (DIFLUCAN) 150 MG tablet Take 1 tablet (150 mg total) by mouth daily. 1 tablet Stanley, Acie Fredrickson, [place]      PDMP not reviewed this encounter.   Gustavus Bryant, [place] 12/31/22 [phone]

## 2022-12-31 NOTE — Discharge Instructions (Signed)
Your vaginal swab is pending.  Will call with results.  I have prescribed you Diflucan but recommend not taking until 3 days after the other pill that you took.  Follow-up if any symptoms persist or worsen.

## 2022-12-31 NOTE — ED Triage Notes (Signed)
Pt reports she thinks she has a yeast infection. Pt has some irritation and itching x 2 days. Took a left over diflucan. SXS slightly improved.  Has recently had unprotected sex with new partner.

## 2023-01-01 LAB — CERVICOVAGINAL ANCILLARY ONLY
Bacterial Vaginitis (gardnerella): NEGATIVE
Candida Glabrata: NEGATIVE
Candida Vaginitis: POSITIVE — AB
Chlamydia: NEGATIVE
Comment: NEGATIVE
Comment: NEGATIVE
Comment: NEGATIVE
Comment: NEGATIVE
Comment: NEGATIVE
Comment: NORMAL
Neisseria Gonorrhea: NEGATIVE
Trichomonas: POSITIVE — AB

## 2023-01-02 ENCOUNTER — Telehealth (HOSPITAL_COMMUNITY): Payer: Self-pay | Admitting: Emergency Medicine

## 2023-01-02 MED ORDER — METRONIDAZOLE 500 MG PO TABS: 500.0000 mg | ORAL_TABLET | Freq: Two times a day (BID) | ORAL | 0 refills | Status: DC

## 2023-01-04 ENCOUNTER — Telehealth (HOSPITAL_COMMUNITY): Payer: Self-pay | Admitting: Emergency Medicine

## 2023-01-04 MED ORDER — METRONIDAZOLE 500 MG PO TABS: 500.0000 mg | ORAL_TABLET | Freq: Two times a day (BID) | ORAL | 0 refills | Status: DC

## 2023-01-04 NOTE — Telephone Encounter (Signed)
Patient preferred a different pharmacy 

## 2023-06-09 ENCOUNTER — Ambulatory Visit
Admission: EM | Admit: 2023-06-09 | Discharge: 2023-06-09 | Disposition: A | Discharge status: Home / Self Care | Payer: BC Managed Care – PPO | Attending: Physician Assistant | Admitting: Physician Assistant

## 2023-06-09 DIAGNOSIS — B379 Candidiasis, unspecified: Secondary | ICD-10-CM

## 2023-06-09 DIAGNOSIS — B3731 Acute candidiasis of vulva and vagina: Secondary | ICD-10-CM | POA: Insufficient documentation

## 2023-06-09 DIAGNOSIS — N898 Other specified noninflammatory disorders of vagina: Secondary | ICD-10-CM | POA: Diagnosis not present

## 2023-06-09 MED ORDER — FLUCONAZOLE 150 MG PO TABS: 150.0000 mg | ORAL_TABLET | Freq: Every day | ORAL | 2 refills | Status: DC

## 2023-06-09 NOTE — ED Provider Notes (Signed)
[facility]    CSN: [id number] Arrival date & time: 06/09/23  0801      History   Chief Complaint Chief Complaint  Patient presents with   Vaginitis   SEXUALLY TRANSMITTED DISEASE    Testing    HPI Heidi Foley is a 27 y.o. female.   Pt complains of a vaginal discharge.  Pt reports thinks she has a yeast infection  The history is provided by the patient. No language interpreter was used.    Past Medical History:  Diagnosis Date   Asthma    Eczema    Fibroids    Hypoglycemia     Patient Active Problem List   Diagnosis Date Noted   Slow transit constipation 09/20/2014    Past Surgical History:  Procedure Laterality Date   nailbed surgery     nailbed reconstruction left ring finger   ROOT CANAL      OB History     Gravida  0   Para  0   Term  0   Preterm  0   AB  0   Living  0      SAB  0   IAB  0   Ectopic  0   Multiple  0   Live Births               Home Medications    Prior to Admission medications   Medication Sig Start Date End Date Taking? Authorizing Provider  albuterol (VENTOLIN HFA) 108 (90 Base) MCG/ACT inhaler Inhale 2 puffs into the lungs every 4 (four) hours as needed for wheezing or shortness of breath. 02/26/20  Yes [provider]  fluconazole (DIFLUCAN) 150 MG tablet Take 1 tablet (150 mg total) by mouth daily. 06/09/23  Yes Cheron Schaumann K, PA-C  fluticasone (FLONASE) 50 MCG/ACT nasal spray Place 1 spray into both nostrils 2 (two) times daily. 02/26/20  Yes [provider]  metroNIDAZOLE (FLAGYL) 500 MG tablet Take 1 tablet (500 mg total) by mouth 2 (two) times daily. 01/04/23   Lamptey, Britta Mccreedy, MD  nitrofurantoin, macrocrystal-monohydrate, (MACROBID) 100 MG capsule Take 1 capsule (100 mg total) by mouth 2 (two) times daily. 09/25/22   Barbette Merino, NP    Family History Family History  Problem Relation Age of Onset   Hypertension Mother    Heart disease Other    Cancer Other     Diabetes Other    Stroke Other    Hypertension Maternal Grandmother    Diabetes Maternal Grandfather    Asthma Brother     Social History Social History   Tobacco Use   Smoking status: Never    Passive exposure: Never   Smokeless tobacco: Never  Vaping Use   Vaping status: Never Used  Substance Use Topics   Alcohol use: No   Drug use: No     Allergies   Bee venom, Citrus, and Sulfa antibiotics   Review of Systems Review of Systems  All other systems reviewed and are negative.    Physical Exam Triage Vital Signs ED Triage Vitals  Encounter Vitals Group     BP 06/09/23 0809 110/73     Systolic BP Percentile --      Diastolic BP Percentile --      Pulse Rate 06/09/23 0809 83     Resp 06/09/23 0809 16     Temp 06/09/23 0809 98.1 F (36.7 C)     Temp Source 06/09/23 0809 Oral  SpO2 06/09/23 0809 98 %     Weight 06/09/23 0807 150 lb (68 kg)     Height 06/09/23 0807 5\' 3"  (1.6 m)     Head Circumference --      Peak Flow --      Pain Score 06/09/23 0804 0     Pain Loc --      Pain Education --      Exclude from Growth Chart --    No data found.  Updated Vital Signs BP 110/73 (BP Location: Left Arm)   Pulse 83   Temp 98.1 F (36.7 C) (Oral)   Resp 16   Ht 5\' 3"  (1.6 m)   Wt 68 kg   LMP 06/04/2023 (Exact Date)   SpO2 98%   BMI 26.57 kg/m   Visual Acuity Right Eye Distance:   Left Eye Distance:   Bilateral Distance:    Right Eye Near:   Left Eye Near:    Bilateral Near:     Physical Exam Constitutional:      Appearance: She is well-developed.  HENT:     Head: Normocephalic and atraumatic.  Cardiovascular:     Rate and Rhythm: Normal rate.  Pulmonary:     Effort: Pulmonary effort is normal.  Genitourinary:    Vagina: Vaginal discharge present.     Comments: Vaginal discharge,  Thick white,  Adnexa no masses,  Cervix nontender Musculoskeletal:        General: Normal range of motion.  Skin:    General: Skin is warm.      UC  Treatments / Results  Labs (all labs ordered are listed, but only abnormal results are displayed) Labs Reviewed  CERVICOVAGINAL ANCILLARY ONLY    EKG   Radiology No results found.  Procedures Procedures (including critical care time)  Medications Ordered in UC Medications - No data to display  Initial Impression / Assessment and Plan / UC Course  I have reviewed the triage vital signs and the nursing notes.  Pertinent labs & imaging results that were available during my care of the patient were reviewed by me and considered in my medical decision making (see chart for details).      Final Clinical Impressions(s) / UC Diagnoses   Final diagnoses:  Yeast infection   Discharge Instructions   None    ED Prescriptions     Medication Sig Dispense Auth. Provider   fluconazole (DIFLUCAN) 150 MG tablet Take 1 tablet (150 mg total) by mouth daily. 2 tablet Elson Areas, [place]      PDMP not reviewed this encounter. An After Visit Summary was printed and given to the patient.       Elson Areas, [place] 06/09/23 [phone]

## 2023-06-09 NOTE — ED Triage Notes (Signed)
"  I think I have a yeast infection, I normally have them a lot". Started with "vaginal irritation in beginning and now white/thick discharge". No recent antibiotic use. "I would like also like STI testing, no known exposure or symptoms".

## 2023-06-10 ENCOUNTER — Telehealth: Payer: Self-pay

## 2023-06-10 LAB — CERVICOVAGINAL ANCILLARY ONLY
Bacterial Vaginitis (gardnerella): POSITIVE — AB
Candida Glabrata: NEGATIVE
Candida Vaginitis: POSITIVE — AB
Chlamydia: NEGATIVE
Comment: NEGATIVE
Comment: NEGATIVE
Comment: NEGATIVE
Comment: NEGATIVE
Comment: NEGATIVE
Comment: NORMAL
Neisseria Gonorrhea: NEGATIVE
Trichomonas: NEGATIVE

## 2023-06-10 MED ORDER — METRONIDAZOLE 500 MG PO TABS: 500.0000 mg | ORAL_TABLET | Freq: Two times a day (BID) | ORAL | 0 refills | Status: AC

## 2023-06-10 NOTE — Telephone Encounter (Signed)
 Per protocol, pt requires tx with metronidazole. Attempted to reach patient x1. LVM. Rx sent to pharmacy on file.

## 2023-09-17 ENCOUNTER — Encounter (HOSPITAL_BASED_OUTPATIENT_CLINIC_OR_DEPARTMENT_OTHER): Payer: Self-pay | Admitting: Emergency Medicine

## 2023-09-17 ENCOUNTER — Other Ambulatory Visit: Payer: Self-pay

## 2023-09-17 ENCOUNTER — Ambulatory Visit: Payer: Self-pay

## 2023-09-17 ENCOUNTER — Emergency Department (HOSPITAL_BASED_OUTPATIENT_CLINIC_OR_DEPARTMENT_OTHER)
Admission: EM | Admit: 2023-09-17 | Discharge: 2023-09-17 | Disposition: A | Discharge status: Home / Self Care | Payer: 59 | Attending: Emergency Medicine | Admitting: Emergency Medicine

## 2023-09-17 DIAGNOSIS — J101 Influenza due to other identified influenza virus with other respiratory manifestations: Secondary | ICD-10-CM | POA: Diagnosis not present

## 2023-09-17 DIAGNOSIS — R519 Headache, unspecified: Secondary | ICD-10-CM | POA: Diagnosis present

## 2023-09-17 LAB — RESP PANEL BY RT-PCR (RSV, FLU A&B, COVID)  RVPGX2
Influenza A by PCR: POSITIVE — AB
Influenza B by PCR: NEGATIVE
Resp Syncytial Virus by PCR: NEGATIVE
SARS Coronavirus 2 by RT PCR: NEGATIVE

## 2023-09-17 MED ORDER — SODIUM CHLORIDE 0.9 % IV BOLUS
1000.0000 mL | Freq: Once | INTRAVENOUS | Status: AC
  Administered 2023-09-17: 1000 mL via INTRAVENOUS

## 2023-09-17 MED ORDER — KETOROLAC TROMETHAMINE 15 MG/ML IJ SOLN
15.0000 mg | Freq: Once | INTRAMUSCULAR | Status: AC
  Administered 2023-09-17: 15 mg via INTRAVENOUS
  Filled 2023-09-17: qty 1

## 2023-09-17 MED ORDER — ACETAMINOPHEN 500 MG PO TABS
1000.0000 mg | ORAL_TABLET | Freq: Once | ORAL | Status: AC
  Administered 2023-09-17: 1000 mg via ORAL
  Filled 2023-09-17: qty 2

## 2023-09-17 NOTE — ED Provider Notes (Signed)
 [place] EMERGENCY DEPARTMENT AT [facility] Provider Note   CSN: [id number] Arrival date & time: 09/17/23  4098     History  Chief Complaint  Patient presents with   Flu-like Symptoms    Heidi Foley is a 28 y.o. female.  Otherwise healthy 28 year old female who is here today for nasal congestion, myalgias, headache, diarrhea that began on Tuesday.  Patient says her symptoms first began with congestion, myalgias, fever and cough.  Says that this morning she developed diarrhea and abdominal pain.  Has been taking over-the-counter cough medicine at home.        Home Medications Prior to Admission medications   Medication Sig Start Date End Date Taking? Authorizing Provider  albuterol (VENTOLIN HFA) 108 (90 Base) MCG/ACT inhaler Inhale 2 puffs into the lungs every 4 (four) hours as needed for wheezing or shortness of breath. 02/26/20   [provider]  fluconazole (DIFLUCAN) 150 MG tablet Take 1 tablet (150 mg total) by mouth daily. 06/09/23   Elson Areas, PA-C  fluticasone (FLONASE) 50 MCG/ACT nasal spray Place 1 spray into both nostrils 2 (two) times daily. 02/26/20   [provider]  metroNIDAZOLE (FLAGYL) 500 MG tablet Take 1 tablet (500 mg total) by mouth 2 (two) times daily. 01/04/23   Lamptey, Britta Mccreedy, MD  nitrofurantoin, macrocrystal-monohydrate, (MACROBID) 100 MG capsule Take 1 capsule (100 mg total) by mouth 2 (two) times daily. 09/25/22   Barbette Merino, NP      Allergies    Bee venom, Citrus, and Sulfa antibiotics    Review of Systems   Review of Systems  Physical Exam Updated Vital Signs BP 121/81   Pulse (!) 106   Temp 98.8 F (37.1 C) (Oral)   Resp 20   SpO2 98%  Physical Exam Vitals reviewed.  Constitutional:      Appearance: She is not toxic-appearing.  HENT:     Head: Normocephalic.     Mouth/Throat:     Mouth: Mucous membranes are moist.     Pharynx: No oropharyngeal exudate.  Eyes:     Pupils: Pupils are  equal, round, and reactive to light.  Cardiovascular:     Rate and Rhythm: Tachycardia present.  Abdominal:     General: Abdomen is flat. There is no distension.     Palpations: Abdomen is soft.     Tenderness: There is no abdominal tenderness. There is no guarding.  Musculoskeletal:     Cervical back: Normal range of motion.  Neurological:     Gait: Gait normal.     ED Results / Procedures / Treatments   Labs (all labs ordered are listed, but only abnormal results are displayed) Labs Reviewed  RESP PANEL BY RT-PCR (RSV, FLU A&B, COVID)  RVPGX2 - Abnormal; Notable for the following components:      Result Value   Influenza A by PCR POSITIVE (*)    All other components within normal limits    EKG None  Radiology No results found.  Procedures Procedures    Medications Ordered in ED Medications  acetaminophen (TYLENOL) tablet 1,000 mg (has no administration in time range)  ketorolac (TORADOL) 15 MG/ML injection 15 mg (15 mg Intravenous Given 09/17/23 0827)  sodium chloride 0.9 % bolus 1,000 mL (1,000 mLs Intravenous New Bag/Given 09/17/23 0827)    ED Course/ Medical Decision Making/ A&P  Medical Decision Making This is a 28 year old female who is here today for cough, congestion, diarrhea which began today.  Differential diagnoses include viral syndrome, less likely acute intra-abdominal infection, less likely bacterial infection.  Plan- patient's symptoms are most consistent with viral syndrome.  Currently experiencing high volumes of influenza in our community.  After my assessment of the patient, cancel triage lab orders, ordered viral swab.  Will provide patient with some Toradol, fluids and reassess.  Reassessment 9:15 AM-patient flu positive.  She is feeling bit better after Toradol, have added in some Tylenol for the patient.  She is received fluids.  Vital signs reassuring.  Will discharge patient.  Risk OTC drugs. Prescription  drug management.           Final Clinical Impression(s) / ED Diagnoses Final diagnoses:  Influenza A    Rx / DC Orders ED Discharge Orders     None         Arletha Pili, DO 09/17/23 [phone]

## 2023-09-17 NOTE — ED Triage Notes (Signed)
 Pt arrived POV, caox4, ambulatory, NAD. Pt c/o headache, bodyaches, chills, productive cough, weakness, and started having diarrhea this morning.

## 2023-09-17 NOTE — Discharge Instructions (Addendum)
 You tested positive for influenza.  The symptoms typically run their course in about 5 to 7 days from when symptoms began.  Make sure that you are drinking plenty of water.  You can take Motrin and Tylenol at home for aches and pains.

## 2023-11-11 ENCOUNTER — Encounter (HOSPITAL_BASED_OUTPATIENT_CLINIC_OR_DEPARTMENT_OTHER): Payer: Self-pay | Admitting: Emergency Medicine

## 2023-11-11 ENCOUNTER — Other Ambulatory Visit: Payer: Self-pay

## 2023-11-11 DIAGNOSIS — R110 Nausea: Secondary | ICD-10-CM | POA: Diagnosis not present

## 2023-11-11 DIAGNOSIS — Z5321 Procedure and treatment not carried out due to patient leaving prior to being seen by health care provider: Secondary | ICD-10-CM | POA: Diagnosis not present

## 2023-11-11 DIAGNOSIS — R519 Headache, unspecified: Secondary | ICD-10-CM | POA: Diagnosis present

## 2023-11-11 LAB — PREGNANCY, URINE: Preg Test, Ur: NEGATIVE

## 2023-11-11 NOTE — ED Triage Notes (Signed)
 HA x 2 months. Ibuprofen almost daily- helps on and off. Nauseated 3-4 days.

## 2023-11-12 ENCOUNTER — Emergency Department (HOSPITAL_BASED_OUTPATIENT_CLINIC_OR_DEPARTMENT_OTHER)
Admission: EM | Admit: 2023-11-12 | Discharge: 2023-11-12 | Discharge status: Against Medical Advice | Attending: Emergency Medicine | Admitting: Emergency Medicine

## 2023-11-12 NOTE — ED Notes (Signed)
 Registration reports that pt told them that she was leaving and staff witnessed pt ambulating out

## 2024-02-21 ENCOUNTER — Ambulatory Visit
Admission: RE | Admit: 2024-02-21 | Discharge: 2024-02-21 | Disposition: A | Discharge status: Home / Self Care | Source: Ambulatory Visit | Attending: Physician Assistant | Admitting: Physician Assistant

## 2024-02-21 ENCOUNTER — Encounter: Payer: Self-pay | Admitting: Emergency Medicine

## 2024-02-21 ENCOUNTER — Ambulatory Visit
Admission: RE | Admit: 2024-02-21 | Discharge: 2024-02-21 | Disposition: A | Discharge status: Home / Self Care | Payer: Self-pay | Source: Ambulatory Visit

## 2024-02-21 DIAGNOSIS — N760 Acute vaginitis: Secondary | ICD-10-CM | POA: Diagnosis not present

## 2024-02-21 NOTE — ED Triage Notes (Signed)
 Pt reports intermittent vaginal irritation x3 days. Denies itching or discharge. No concerns for STIs. Notes she did change her body wash and started chlorophyll supplements prior to symptoms starting.

## 2024-02-21 NOTE — ED Provider Notes (Signed)
 [facility]    CSN: [id number] Arrival date & time: 02/21/24  0854      History   Chief Complaint Chief Complaint  Patient presents with   vaginal irritation    HPI Heidi Foley is a 28 y.o. female.   Patient in today for evaluation of vaginal irritation that started 3 days ago.  She denies itching or discharge.  She denies any known exposures to STDs.  She has not any genital rash.  The history is provided by the patient.    Past Medical History:  Diagnosis Date   Asthma    Eczema    Fibroids    Hypoglycemia     Patient Active Problem List   Diagnosis Date Noted   Slow transit constipation 09/20/2014    Past Surgical History:  Procedure Laterality Date   nailbed surgery     nailbed reconstruction left ring finger   ROOT CANAL      OB History     Gravida  0   Para  0   Term  0   Preterm  0   AB  0   Living  0      SAB  0   IAB  0   Ectopic  0   Multiple  0   Live Births               Home Medications    Prior to Admission medications   Medication Sig Start Date End Date Taking? Authorizing Provider  albuterol  (VENTOLIN  HFA) 108 (90 Base) MCG/ACT inhaler Inhale 2 puffs into the lungs every 4 (four) hours as needed for wheezing or shortness of breath. Patient not taking: Reported on 02/21/2024 02/26/20   [provider]  fluconazole  (DIFLUCAN ) 150 MG tablet Take 1 tablet (150 mg total) by mouth daily. Patient not taking: Reported on 02/21/2024 06/09/23   Sofia, Leslie K, PA-C  fluticasone (FLONASE) 50 MCG/ACT nasal spray Place 1 spray into both nostrils 2 (two) times daily. Patient not taking: Reported on 02/21/2024 02/26/20   [provider]  metroNIDAZOLE  (FLAGYL ) 500 MG tablet Take 1 tablet (500 mg total) by mouth 2 (two) times daily. Patient not taking: Reported on 02/21/2024 01/04/23   Blaise Aleene KIDD, MD  nitrofurantoin , macrocrystal-monohydrate, (MACROBID ) 100 MG capsule Take 1 capsule (100 mg  total) by mouth 2 (two) times daily. Patient not taking: Reported on 02/21/2024 09/25/22   Myrna Camelia CHRISTELLA, NP    Family History Family History  Problem Relation Age of Onset   Hypertension Mother    Heart disease Other    Cancer Other    Diabetes Other    Stroke Other    Hypertension Maternal Grandmother    Diabetes Maternal Grandfather    Asthma Brother     Social History Social History   Tobacco Use   Smoking status: Never    Passive exposure: Never   Smokeless tobacco: Never  Vaping Use   Vaping status: Never Used  Substance Use Topics   Alcohol use: No   Drug use: No     Allergies   Bee venom, Citrus, and Sulfa  antibiotics   Review of Systems Review of Systems  Constitutional:  Negative for chills and fever.  Eyes:  Negative for discharge and redness.  Respiratory:  Negative for shortness of breath.   Gastrointestinal:  Negative for abdominal pain, nausea and vomiting.  Genitourinary:  Negative for genital sores and vaginal discharge.     Physical Exam Triage  Vital Signs ED Triage Vitals  Encounter Vitals Group     BP      Girls Systolic BP Percentile      Girls Diastolic BP Percentile      Boys Systolic BP Percentile      Boys Diastolic BP Percentile      Pulse      Resp      Temp      Temp src      SpO2      Weight      Height      Head Circumference      Peak Flow      Pain Score      Pain Loc      Pain Education      Exclude from Growth Chart    No data found.  Updated Vital Signs BP 104/71 (BP Location: Left Arm)   Pulse 84   Temp 98.1 F (36.7 C) (Oral)   Resp 16   LMP 01/29/2024 (Approximate)   SpO2 97%   Visual Acuity Right Eye Distance:   Left Eye Distance:   Bilateral Distance:    Right Eye Near:   Left Eye Near:    Bilateral Near:     Physical Exam Vitals and nursing note reviewed.  Constitutional:      General: She is not in acute distress.    Appearance: Normal appearance. She is not ill-appearing.  HENT:      Head: Normocephalic and atraumatic.  Eyes:     Conjunctiva/sclera: Conjunctivae normal.  Cardiovascular:     Rate and Rhythm: Normal rate.  Pulmonary:     Effort: Pulmonary effort is normal. No respiratory distress.  Neurological:     Mental Status: She is alert.  Psychiatric:        Mood and Affect: Mood normal.        Behavior: Behavior normal.        Thought Content: Thought content normal.      UC Treatments / Results  Labs (all labs ordered are listed, but only abnormal results are displayed) Labs Reviewed  CERVICOVAGINAL ANCILLARY ONLY    EKG   Radiology No results found.  Procedures Procedures (including critical care time)  Medications Ordered in UC Medications - No data to display  Initial Impression / Assessment and Plan / UC Course  I have reviewed the triage vital signs and the nursing notes.  Pertinent labs & imaging results that were available during my care of the patient were reviewed by me and considered in my medical decision making (see chart for details).    Will screen for gonorrhea, chlamydia, trichomonas, BV and yeast.  Will await results for further recommendation.  Final Clinical Impressions(s) / UC Diagnoses   Final diagnoses:  Acute vaginitis   Discharge Instructions   None    ED Prescriptions   None    PDMP not reviewed this encounter.   Billy Asberry FALCON, PA-C 02/21/24 1051

## 2024-02-22 ENCOUNTER — Ambulatory Visit (HOSPITAL_COMMUNITY): Payer: Self-pay

## 2024-02-22 LAB — CERVICOVAGINAL ANCILLARY ONLY
Bacterial Vaginitis (gardnerella): POSITIVE — AB
Candida Glabrata: NEGATIVE
Candida Vaginitis: POSITIVE — AB
Chlamydia: NEGATIVE
Comment: NEGATIVE
Comment: NEGATIVE
Comment: NEGATIVE
Comment: NEGATIVE
Comment: NEGATIVE
Comment: NORMAL
Neisseria Gonorrhea: NEGATIVE
Trichomonas: NEGATIVE

## 2024-02-22 MED ORDER — FLUCONAZOLE 150 MG PO TABS
150.0000 mg | ORAL_TABLET | Freq: Once | ORAL | 0 refills | Status: AC
Start: 2024-02-22 — End: 2024-02-22

## 2024-02-22 MED ORDER — METRONIDAZOLE 500 MG PO TABS: 500.0000 mg | ORAL_TABLET | Freq: Two times a day (BID) | ORAL | 0 refills | Status: AC

## 2024-03-30 ENCOUNTER — Other Ambulatory Visit: Payer: Self-pay

## 2024-03-30 ENCOUNTER — Emergency Department (HOSPITAL_BASED_OUTPATIENT_CLINIC_OR_DEPARTMENT_OTHER)
Admission: EM | Admit: 2024-03-30 | Discharge: 2024-03-30 | Disposition: A | Discharge status: Home / Self Care | Attending: Emergency Medicine | Admitting: Emergency Medicine

## 2024-03-30 DIAGNOSIS — J020 Streptococcal pharyngitis: Secondary | ICD-10-CM | POA: Diagnosis not present

## 2024-03-30 DIAGNOSIS — J029 Acute pharyngitis, unspecified: Secondary | ICD-10-CM | POA: Diagnosis present

## 2024-03-30 LAB — GROUP A STREP BY PCR: Group A Strep by PCR: DETECTED — AB

## 2024-03-30 LAB — RESP PANEL BY RT-PCR (RSV, FLU A&B, COVID)  RVPGX2
Influenza A by PCR: NEGATIVE
Influenza B by PCR: NEGATIVE
Resp Syncytial Virus by PCR: NEGATIVE
SARS Coronavirus 2 by RT PCR: NEGATIVE

## 2024-03-30 MED ORDER — ACETAMINOPHEN 500 MG PO TABS
1000.0000 mg | ORAL_TABLET | Freq: Once | ORAL | Status: AC
  Administered 2024-03-30: 1000 mg via ORAL
  Filled 2024-03-30: qty 2

## 2024-03-30 MED ORDER — AMOXICILLIN 500 MG PO CAPS
1000.0000 mg | ORAL_CAPSULE | Freq: Once | ORAL | Status: AC
  Administered 2024-03-30: 1000 mg via ORAL
  Filled 2024-03-30: qty 2

## 2024-03-30 MED ORDER — AMOXICILLIN 500 MG PO CAPS: 1000.0000 mg | ORAL_CAPSULE | Freq: Every day | ORAL | 0 refills | Status: AC

## 2024-03-30 NOTE — ED Triage Notes (Signed)
 Patient states headache, chills, body aches since yesterday.

## 2024-03-30 NOTE — ED Provider Notes (Signed)
 [place] EMERGENCY DEPARTMENT AT [facility] Provider Note   CSN: [id number] Arrival date & time: 03/30/24  1339     Patient presents with: Fatigue   Heidi Foley is a 28 y.o. female.   Patient presents to the emergency department today for evaluation of sore throat, chills, body aches and headache.  No significant cough.  Patient is a Runner, broadcasting/film/video.  No documented fever.  She took NyQuil last night, no treatment today.  No vomiting or diarrhea.       Prior to Admission medications   Medication Sig Start Date End Date Taking? Authorizing Provider  albuterol  (VENTOLIN  HFA) 108 (90 Base) MCG/ACT inhaler Inhale 2 puffs into the lungs every 4 (four) hours as needed for wheezing or shortness of breath. Patient not taking: Reported on 02/21/2024 02/26/20   [provider]  fluconazole  (DIFLUCAN ) 150 MG tablet Take 1 tablet (150 mg total) by mouth daily. Patient not taking: Reported on 02/21/2024 06/09/23   Sofia, Leslie K, PA-C  fluticasone (FLONASE) 50 MCG/ACT nasal spray Place 1 spray into both nostrils 2 (two) times daily. Patient not taking: Reported on 02/21/2024 02/26/20   [provider]  metroNIDAZOLE  (FLAGYL ) 500 MG tablet Take 1 tablet (500 mg total) by mouth 2 (two) times daily. Patient not taking: Reported on 02/21/2024 01/04/23   Blaise Aleene KIDD, MD  nitrofurantoin , macrocrystal-monohydrate, (MACROBID ) 100 MG capsule Take 1 capsule (100 mg total) by mouth 2 (two) times daily. Patient not taking: Reported on 02/21/2024 09/25/22   Myrna Camelia CHRISTELLA, NP    Allergies: Bee venom, Citrus, and Sulfa  antibiotics    Review of Systems  Updated Vital Signs BP 123/76 (BP Location: Right Arm)   Pulse (!) 114   Temp 99 F (37.2 C) (Oral)   Resp 16   SpO2 100%   Physical Exam Vitals and nursing note reviewed.  Constitutional:      Appearance: She is well-developed.     Comments: Temp 100.2 F on my check  HENT:     Head: Normocephalic and atraumatic.     Jaw:  No trismus.     Right Ear: Tympanic membrane, ear canal and external ear normal.     Left Ear: Tympanic membrane, ear canal and external ear normal.     Nose: Nose normal. No mucosal edema or rhinorrhea.     Mouth/Throat:     Mouth: Mucous membranes are moist. Mucous membranes are not dry. No oral lesions.     Pharynx: Uvula midline. Posterior oropharyngeal erythema present. No oropharyngeal exudate or uvula swelling.     Tonsils: No tonsillar abscesses.  Eyes:     General:        Right eye: No discharge.        Left eye: No discharge.     Conjunctiva/sclera: Conjunctivae normal.  Cardiovascular:     Rate and Rhythm: Regular rhythm. Tachycardia present.     Heart sounds: Normal heart sounds.  Pulmonary:     Effort: Pulmonary effort is normal. No respiratory distress.     Breath sounds: Normal breath sounds. No wheezing or rales.  Abdominal:     Palpations: Abdomen is soft.     Tenderness: There is no abdominal tenderness.  Musculoskeletal:     Cervical back: Normal range of motion and neck supple.  Lymphadenopathy:     Cervical: No cervical adenopathy.  Skin:    General: Skin is warm and dry.  Neurological:     Mental Status: She is alert.  Psychiatric:        Mood and Affect: Mood normal.     (all labs ordered are listed, but only abnormal results are displayed) Labs Reviewed  GROUP A STREP BY PCR - Abnormal; Notable for the following components:      Result Value   Group A Strep by PCR DETECTED (*)    All other components within normal limits  RESP PANEL BY RT-PCR (RSV, FLU A&B, COVID)  [id number]    EKG: None  Radiology: No results found.   Procedures   Medications Ordered in the ED  acetaminophen  (TYLENOL ) tablet 1,000 mg (has no administration in time range)   ED Course  Patient seen and examined. History obtained directly from patient. Work-up including labs, imaging, EKG ordered in triage, if performed, were reviewed.    Labs/EKG: Independently reviewed  and interpreted.  This included: Flu, COVID, RSV was negative, added strep test  Imaging: None ordered  Medications/Fluids: Ordered: Tylenol   Most recent vital signs reviewed and are as follows: BP 123/76 (BP Location: Right Arm)   Pulse (!) 114   Temp 99 F (37.2 C) (Oral)   Resp 16   SpO2 100%   Initial impression: Febrile illness, possibly viral.  Her throat is red and she does report no cough.  Would like to rule out strep throat and need for amoxicillin .  6:08 PM Reassessment performed. Patient appears stable.  Upon entering room, heart rate was 108.  Labs personally reviewed and interpreted including: Strep test positive.  Reviewed pertinent lab work and imaging with patient at bedside. Questions answered.  Offered oral amoxicillin  versus IM Bicillin.  Patient would like oral amoxicillin .  Most current vital signs reviewed and are as follows: BP 120/71   Pulse (!) 123   Temp 100.2 F (37.9 C) (Oral)   Resp 20   SpO2 100%   Plan: Discharge to home.   Prescriptions written for: Amoxicillin   Other home care instructions discussed: NSAIDs OTC, hydration  ED return instructions discussed: Difficulty swallowing, worsening uncontrolled pain  Follow-up instructions discussed: Patient encouraged to follow-up with their PCP in 5 days If not improving                                  Medical Decision Making Risk OTC drugs. Prescription drug management.   In regards to the patient's sore throat today, the following dangerous and potentially life threatening etiologies were considered on the differential diagnosis: Lugwig's angina, uvulitis, epiglottis, peritonsillar abscess, retropharyngeal abscess, Lemierre's syndrome. Also considered were more common causes such as: streptococcal pharyngitis, gonococcal pharyngitis, non-bacterial pharyngitis (cold viruses, HSV/coxsackievirus, influenza, COVID-19, infectious mononucleosis, oropharyngeal candidiasis), and other  non-infectious causes including seasonal allergies/post-nasal drip, GERD/esophagitis, trauma.   Patient's strep test is positive.  No complicating factors at current time.  She will need oral antibiotics and rest, hydration. Work note provided.  The patient's vital signs, pertinent lab work and imaging were reviewed and interpreted as discussed in the ED course. Hospitalization was considered for further testing, treatments, or serial exams/observation. However as patient is well-appearing, has a stable exam, and reassuring studies today, I do not feel that they warrant admission at this time. This plan was discussed with the patient who verbalizes agreement and comfort with this plan and seems reliable and able to return to the Emergency Department with worsening or changing symptoms.       Final diagnoses:  Acute streptococcal pharyngitis  ED Discharge Orders          Ordered    amoxicillin  (AMOXIL ) 500 MG capsule  Daily        03/30/24 1807               Desiderio Chew, PA-C 03/30/24 1809    Zackowski, Scott, MD 03/31/24 [phone]

## 2024-03-30 NOTE — Discharge Instructions (Signed)
 Please read and follow all provided instructions.  Your diagnoses today include:  1. Acute streptococcal pharyngitis     Tests performed today include: Strep test: was POSITIVE for strep throat Vital signs. See below for your results today.   Medications prescribed:  Amoxicillin  - antibiotic  You have been prescribed an antibiotic medicine: take the entire course of medicine even if you are feeling better. Stopping early can cause the antibiotic not to work.  Please use over-the-counter NSAID medications (ibuprofen , naproxen ) or Tylenol  (acetaminophen ) as directed on the packaging for pain -- as long as you do not have any reasons avoid these medications. Reasons to avoid NSAID medications include: weak kidneys, a history of bleeding in your stomach or gut, or uncontrolled high blood pressure or previous heart attack. Reasons to avoid Tylenol  include: liver problems or ongoing alcohol use. Never take more than 4000mg  or 8 Extra strength Tylenol  in a 24 hour period.     Take any medications prescribed only as directed.   Home care instructions:  Please read the educational materials provided and follow any instructions contained in this packet.  Follow-up instructions: Please follow-up with your primary care provider as needed for further evaluation of your symptoms.  Return instructions:  Please return to the Emergency Department if you experience worsening symptoms.  Return if you have worsening problems swallowing, your neck becomes swollen, you cannot swallow your saliva or your voice becomes muffled.  Return with high persistent fever, persistent vomiting, or if you have trouble breathing.  Please return if you have any other emergent concerns.  Additional Information:  Your vital signs today were: BP 120/71   Pulse (!) 123   Temp 100.2 F (37.9 C) (Oral)   Resp 20   SpO2 100%  If your blood pressure (BP) was elevated above 135/85 this visit, please have this repeated by  your doctor within one month. --------------

## 2024-03-30 NOTE — ED Notes (Signed)
